# Patient Record
Sex: Female | Born: 1990 | Race: Black or African American | Hispanic: No | Marital: Married | Smoking: Never smoker
Health system: Southern US, Community
[De-identification: ages and names within clinical notes are randomized; demographics above are authoritative.]

## PROBLEM LIST (undated history)

## (undated) DIAGNOSIS — K219 Gastro-esophageal reflux disease without esophagitis: Secondary | ICD-10-CM

## (undated) DIAGNOSIS — F09 Unspecified mental disorder due to known physiological condition: Secondary | ICD-10-CM

## (undated) DIAGNOSIS — F419 Anxiety disorder, unspecified: Secondary | ICD-10-CM

## (undated) DIAGNOSIS — E7800 Pure hypercholesterolemia, unspecified: Secondary | ICD-10-CM

## (undated) DIAGNOSIS — Z8619 Personal history of other infectious and parasitic diseases: Secondary | ICD-10-CM

## (undated) DIAGNOSIS — G35 Multiple sclerosis: Secondary | ICD-10-CM

## (undated) HISTORY — DX: Personal history of other infectious and parasitic diseases: Z86.19

## (undated) HISTORY — PX: NO PAST SURGERIES: SHX2092

## (undated) HISTORY — DX: Unspecified mental disorder due to known physiological condition: F09

---

## 2020-11-26 LAB — HM HIV SCREENING LAB: HM HIV Screening: NEGATIVE

## 2020-11-30 DIAGNOSIS — J302 Other seasonal allergic rhinitis: Secondary | ICD-10-CM | POA: Insufficient documentation

## 2020-11-30 DIAGNOSIS — G35 Multiple sclerosis: Secondary | ICD-10-CM | POA: Insufficient documentation

## 2020-12-25 DIAGNOSIS — R768 Other specified abnormal immunological findings in serum: Secondary | ICD-10-CM | POA: Insufficient documentation

## 2021-01-12 ENCOUNTER — Emergency Department: Payer: BC Managed Care – PPO

## 2021-01-12 ENCOUNTER — Encounter: Payer: Self-pay | Admitting: Emergency Medicine

## 2021-01-12 ENCOUNTER — Emergency Department
Admission: EM | Admit: 2021-01-12 | Discharge: 2021-01-13 | Disposition: A | Discharge status: Home / Self Care | Payer: BC Managed Care – PPO | Attending: Emergency Medicine | Admitting: Emergency Medicine

## 2021-01-12 ENCOUNTER — Other Ambulatory Visit: Payer: Self-pay

## 2021-01-12 DIAGNOSIS — R079 Chest pain, unspecified: Secondary | ICD-10-CM | POA: Insufficient documentation

## 2021-01-12 HISTORY — DX: Multiple sclerosis: G35

## 2021-01-12 HISTORY — DX: Gastro-esophageal reflux disease without esophagitis: K21.9

## 2021-01-12 HISTORY — DX: Anxiety disorder, unspecified: F41.9

## 2021-01-12 HISTORY — DX: Pure hypercholesterolemia, unspecified: E78.00

## 2021-01-12 LAB — BASIC METABOLIC PANEL
Anion gap: 4 — ABNORMAL LOW (ref 5–15)
BUN: 15 mg/dL (ref 6–20)
CO2: 28 mmol/L (ref 22–32)
Calcium: 9 mg/dL (ref 8.9–10.3)
Chloride: 104 mmol/L (ref 98–111)
Creatinine, Ser: 1.06 mg/dL — ABNORMAL HIGH (ref 0.44–1.00)
GFR, Estimated: >60 mL/min (ref >60)
Glucose, Bld: 97 mg/dL (ref 70–99)
Potassium: 4.4 mmol/L (ref 3.5–5.1)
Sodium: 136 mmol/L (ref 135–145)

## 2021-01-12 LAB — CBC
HCT: 35 % — ABNORMAL LOW (ref 36.0–46.0)
Hemoglobin: 12.5 g/dL (ref 12.0–15.0)
MCH: 28.5 pg (ref 26.0–34.0)
MCHC: 35.7 g/dL (ref 30.0–36.0)
MCV: 79.9 fL — ABNORMAL LOW (ref 80.0–100.0)
Platelets: 242 10*3/uL (ref 150–400)
RBC: 4.38 MIL/uL (ref 3.87–5.11)
RDW: 14.4 % (ref 11.5–15.5)
WBC: 6.4 10*3/uL (ref 4.0–10.5)
nRBC: 0 % (ref 0.0–0.2)

## 2021-01-12 LAB — TROPONIN I (HIGH SENSITIVITY): Troponin I (High Sensitivity): <2 ng/L (ref <18)

## 2021-01-12 LAB — POC URINE PREG, ED: Preg Test, Ur: NEGATIVE

## 2021-01-12 MED ORDER — IBUPROFEN 400 MG PO TABS
400.0000 mg | ORAL_TABLET | Freq: Once | ORAL | Status: DC
  Filled 2021-01-12: qty 1

## 2021-01-12 NOTE — Discharge Instructions (Signed)
Your EKG, chest x-ray and cardiac enzymes were all reassuring today.  Reasons to return to the emergency department would be if your pain is worsening or you develop any new symptoms that are concerning to you including shortness of breath.  Please follow-up with your primary care provider if symptoms or not improving.  You can take NSAIDs including Motrin for your pain.

## 2021-01-12 NOTE — ED Triage Notes (Signed)
Pt c/o left sided chest pain that radiates to her left shoulder that started about 1900 tonight.

## 2021-01-12 NOTE — ED Provider Notes (Signed)
[facility]  ____________________________________________   Event Date/Time   First MD Initiated Contact with Patient 01/12/21 2305     (approximate)  I have reviewed the triage vital signs and the nursing notes.   HISTORY  Chief Complaint Chest Pain    HPI Allison Moon is a 30 y.o. female with past medical history of multiple sclerosis, hyperlipidemia who presents with chest pain.  Symptoms started around 7 PM tonight.  She endorses sharp pain in the left side of her chest that does not radiate.  There is a constant dull ache and then intermittent sharp pain.  It is worse when she twists her body or with deep breathing.  She denies associated dyspnea nausea vomiting diaphoresis.  No recent illnesses, no fever cough chills hemoptysis.  Denies any precipitating musculoskeletal cause.  The patient denies hx of prior DVT/PE, unilateral leg pain/swelling, hormone use, recent surgery, hx of cancer, prolonged immobilization, or hemoptysis.  She does note that she has been more tired over the last 2 weeks and has been sleeping more frequently.          Past Medical History:  Diagnosis Date   Anxiety    GERD (gastroesophageal reflux disease)    Hypercholesteremia    MS (multiple sclerosis) (HCC)     There are no problems to display for this patient.   History reviewed. No pertinent surgical history.  Prior to Admission medications   Not on File    Allergies Patient has no allergy information on record.  No family history on file.  Social History Social History   Tobacco Use   Smoking status: Never   Smokeless tobacco: Never  Substance Use Topics   Alcohol use: Yes    Comment: occ   Drug use: Never    Review of Systems   Review of Systems  Constitutional:  Negative for chills and fever.  Respiratory:  Positive for chest tightness. Negative for cough and shortness of breath.   Cardiovascular:  Positive for chest pain.  Gastrointestinal:   Negative for abdominal pain, nausea and vomiting.  All other systems reviewed and are negative.  Physical Exam Updated Vital Signs BP 109/73    Pulse 74    Temp 98.7 F (37.1 C)    Resp 18    Ht 5\' 4"  (1.626 m)    Wt 55.8 kg    LMP 12/19/2020    SpO2 99%    BMI 21.11 kg/m   Physical Exam Vitals and nursing note reviewed.  Constitutional:      General: She is not in acute distress.    Appearance: Normal appearance.  HENT:     Head: Normocephalic and atraumatic.  Eyes:     General: No scleral icterus.    Conjunctiva/sclera: Conjunctivae normal.  Cardiovascular:     Rate and Rhythm: Normal rate and regular rhythm.     Heart sounds: Normal heart sounds.  Pulmonary:     Effort: Pulmonary effort is normal. No respiratory distress.     Breath sounds: No stridor.  Musculoskeletal:        General: No deformity or signs of injury.     Cervical back: Normal range of motion.     Right lower leg: No edema.     Left lower leg: No edema.  Skin:    General: Skin is dry.     Coloration: Skin is not jaundiced or pale.  Neurological:     General: No focal deficit present.  Mental Status: She is alert and oriented to person, place, and time. Mental status is at baseline.  Psychiatric:        Mood and Affect: Mood normal.        Behavior: Behavior normal.     LABS (all labs ordered are listed, but only abnormal results are displayed)  Labs Reviewed  BASIC METABOLIC PANEL - Abnormal; Notable for the following components:      Result Value   Creatinine, Ser 1.06 (*)    Anion gap 4 (*)    All other components within normal limits  CBC - Abnormal; Notable for the following components:   HCT 35.0 (*)    MCV 79.9 (*)    All other components within normal limits  POC URINE PREG, ED  TROPONIN I (HIGH SENSITIVITY)   ____________________________________________  EKG  NSR, nml axis, nml intervals, no acute ischemic  changes  ____________________________________________  RADIOLOGY Ky Barban, personally viewed and evaluated these images (plain radiographs) as part of my medical decision making, as well as reviewing the written report by the radiologist.  ED MD interpretation:  I reviewed the CXR which does not show any acute cardiopulmonary process      ____________________________________________   PROCEDURES  Procedure(s) performed (including Critical Care):  Procedures   ____________________________________________   INITIAL IMPRESSION / ASSESSMENT AND PLAN / ED COURSE     Patient is a 30 year old female presenting with chest pain.  Symptoms started tonight.  Pain is atypical and that it is sharp and pleuritic not worse with exertion and worse with twisting movements of her body.  Vital signs within normal limits.  She appears well on exam lungs are clear she has no evidence of DVT on exam.  She is PERC negative.  My suspicion for ACS is exceedingly low given she has no risk factors and pain is very atypical.  Chest x-ray does not show any infiltrate.  Suspect pleurisy versus musculoskeletal etiology.  We discussed following up with her PCP if symptoms or not improved and also discussed return precautions to the ED.  Will treat supportively with NSAIDs.  Clinical Course as of 01/12/21 6468  Sat Jan 12, 2021  2330 Preg Test, Ur: NEGATIVE [KM]    Clinical Course User Index [KM] Georga Hacking, MD     ____________________________________________   FINAL CLINICAL IMPRESSION(S) / ED DIAGNOSES  Final diagnoses:  Chest pain, unspecified type     ED Discharge Orders     None        Note:  This document was prepared using Dragon voice recognition software and may include unintentional dictation errors.    Georga Hacking, MD 01/12/21 [phone]

## 2021-01-12 NOTE — ED Notes (Signed)
Pt had to use bathroom on arrival prior to obtaining EKG.

## 2021-01-13 NOTE — ED Notes (Signed)
Pt discharge information reviewed. Pt understands need for follow up care and when to return if symptoms worsen. All questions answered. Pt is alert and oriented with even and regular respirations. Pt is seen ambulating out of department with string steady gait.   

## 2021-01-21 ENCOUNTER — Encounter: Payer: Self-pay | Admitting: Nurse Practitioner

## 2021-01-21 ENCOUNTER — Other Ambulatory Visit (HOSPITAL_COMMUNITY)
Admission: RE | Admit: 2021-01-21 | Discharge: 2021-01-21 | Disposition: A | Discharge status: Home / Self Care | Payer: BC Managed Care – PPO | Source: Ambulatory Visit | Attending: Family Medicine | Admitting: Family Medicine

## 2021-01-21 ENCOUNTER — Other Ambulatory Visit: Payer: Self-pay

## 2021-01-21 ENCOUNTER — Ambulatory Visit (INDEPENDENT_AMBULATORY_CARE_PROVIDER_SITE_OTHER): Payer: BC Managed Care – PPO | Admitting: Nurse Practitioner

## 2021-01-21 VITALS — BP 110/72 | HR 75 | Temp 97.7°F | Resp 18 | Ht 64.0 in | Wt 125.4 lb

## 2021-01-21 DIAGNOSIS — E785 Hyperlipidemia, unspecified: Secondary | ICD-10-CM

## 2021-01-21 DIAGNOSIS — R1084 Generalized abdominal pain: Secondary | ICD-10-CM | POA: Diagnosis not present

## 2021-01-21 DIAGNOSIS — K219 Gastro-esophageal reflux disease without esophagitis: Secondary | ICD-10-CM | POA: Diagnosis not present

## 2021-01-21 DIAGNOSIS — G35 Multiple sclerosis: Secondary | ICD-10-CM

## 2021-01-21 DIAGNOSIS — N898 Other specified noninflammatory disorders of vagina: Secondary | ICD-10-CM

## 2021-01-21 DIAGNOSIS — R141 Gas pain: Secondary | ICD-10-CM

## 2021-01-21 DIAGNOSIS — Z7689 Persons encountering health services in other specified circumstances: Secondary | ICD-10-CM

## 2021-01-21 NOTE — Progress Notes (Signed)
BP 110/72    Pulse 75    Temp 97.7 F (36.5 C) (Oral)    Resp 18    Ht 5\' 4"  (1.626 m)    Wt 125 lb 6.4 oz (56.9 kg)    LMP 01/13/2021    SpO2 99%    BMI 21.52 kg/m    Subjective:    Patient ID: Allison Moon, female    DOB: 1990/03/19, 31 y.o.   MRN: [id number]  HPI: Allison Moon is a 31 y.o. female, here alone  Chief Complaint  Patient presents with   Establish Care   Establish care: She recently moved from [place]. Last physical was in October.  She says she does have high cholesterol.   MS: She is currently taking  Copaxone but is working on changing to ocrelizumab. Care provided by Dr. Aldean Ast with [facility]. She saw him last on 12/17/2020. Her last MS flare was 11/29/20 where she was admitted for IV steroids. MRI on 12/01/20 showed: 1. New active demyelinating lesion within the right corona radiata measuring 8 mm. 2. There are 2 additional new T2 hyperintense nonenhancing white matter lesions within the supratentorial brain as compared with 05/06/2020 MRI. 3. There are 3 demyelinating lesions within the cervicothoracic cord without evidence of enhancement as detailed above. The upper cervical cord lesion was previously seen and is stable. The additional two lesions were not previously imaged and are age indeterminate. 4. Incidentally noted enlarged thyroid gland without discrete nodule. The preliminary report (critical or emergent communication) was reviewed prior to this dictation and there are no substantial differences between the preliminary results and the impressions in this final report. Electronically Reviewed by: Tyler Aas, MD, [facility] Electronically Reviewed on: 12/01/2020 11:00 AM I have reviewed the images and concur with the above findings. Electronically Signed by: Consuelo Pandy, MD, [facility] Electronically Signed on: 12/01/2020 1:39 PM  Hyperlipidemia:  Working on controlling it with diet and exercise.   Hep B: She recently tested positive for hepatitis B  core antibody for drug pre testing at the neurology clinic. She sees Dr. Guido Sander, from the [facility], for this. She saw him on 12/25/20. Was prescribed tenofovir.  When she received the medication they were blue and she avoids dyes.  PA is being Viread (tenofovir).   GERD/abdominal pain/gas: She says for years she has had chronic abdominal pain, gas and increasing acid reflux.  BMs are about 2-3 times a week. She says she used to be under the care of a GI specialist and would like to be referred to one again since she has moved here.  She says she takes pantoprazole 40 mg daily. Discussed if she is eating foods that have fiber and she says she does.  She says she has tried many things the only time her stomach did not hurt was when she was on a low carb diet but she is not doing that anymore.    Vaginal discharge/itching:  She says she has had scant vaginal discharge but mainly vaginal itching for a few months.  Will get a vaginal swab.   Relevant past medical, surgical, family and social history reviewed and updated as indicated. Interim medical history since our last visit reviewed. Allergies and medications reviewed and updated.  Review of Systems  Constitutional: Negative for fever or weight change.  Respiratory: Negative for cough and shortness of breath.   Cardiovascular: Negative for chest pain or palpitations.  Gastrointestinal: Positive for abdominal pain, no bowel changes, chronically constipated  Genitourinary: positive for vaginal itching and discharge Musculoskeletal: Negative for gait problem or joint swelling.  Skin: Negative for rash.  Neurological: Negative for dizziness or headache.  No other specific complaints in a complete review of systems (except as listed in HPI above).      Objective:    BP 110/72    Pulse 75    Temp 97.7 F (36.5 C) (Oral)    Resp 18    Ht 5\' 4"  (1.626 m)    Wt 125 lb 6.4 oz (56.9 kg)    LMP 01/13/2021    SpO2 99%    BMI 21.52 kg/m   Wt  Readings from Last 3 Encounters:  01/21/21 125 lb 6.4 oz (56.9 kg)  01/12/21 123 lb (55.8 kg)    Physical Exam  Constitutional: Patient appears well-developed and well-nourished.  No distress.  HEENT: head atraumatic, normocephalic, pupils equal and reactive to light, neck supple Cardiovascular: Normal rate, regular rhythm and normal heart sounds.  No murmur heard. No BLE edema. Pulmonary/Chest: Effort normal and breath sounds normal. No respiratory distress. Abdominal: Soft.  There is no tenderness. Psychiatric: Patient has a normal mood and affect. behavior is normal. Judgment and thought content normal.   Results for orders placed or performed during the hospital encounter of [id number]  Basic metabolic panel  Result Value Ref Range   Sodium 136 135 - 145 mmol/L   Potassium 4.4 3.5 - 5.1 mmol/L   Chloride 104 98 - 111 mmol/L   CO2 28 22 - 32 mmol/L   Glucose, Bld 97 70 - 99 mg/dL   BUN 15 6 - 20 mg/dL   Creatinine, Ser 1.06 (H) 0.44 - 1.00 mg/dL   Calcium 9.0 8.9 - 10.3 mg/dL   GFR, Estimated >60 >60 mL/min   Anion gap 4 (L) 5 - 15  CBC  Result Value Ref Range   WBC 6.4 4.0 - 10.5 K/uL   RBC 4.38 3.87 - 5.11 MIL/uL   Hemoglobin 12.5 12.0 - 15.0 g/dL   HCT 35.0 (L) 36.0 - 46.0 %   MCV 79.9 (L) 80.0 - 100.0 fL   MCH 28.5 26.0 - 34.0 pg   MCHC 35.7 30.0 - 36.0 g/dL   RDW 14.4 11.5 - 15.5 %   Platelets 242 150 - 400 K/uL   nRBC 0.0 0.0 - 0.2 %  POC urine preg, ED  Result Value Ref Range   Preg Test, Ur NEGATIVE NEGATIVE  Troponin I (High Sensitivity)  Result Value Ref Range   Troponin I (High Sensitivity) <2 <18 ng/L      Assessment & Plan:   1. Multiple sclerosis ([place]) -continue to follow neurologist recommendations  2. Hyperlipidemia, unspecified hyperlipidemia type -continue working on diet and exercise -will check labs at next visit  3. Gastroesophageal reflux disease without esophagitis -continue current treatment - Ambulatory referral to  Gastroenterology  4. Generalized abdominal pain  - Ambulatory referral to Gastroenterology  5. Gas pain  - Ambulatory referral to Gastroenterology  6. Vaginal discharge  - Cervicovaginal ancillary only  7. Vaginal itching  - Cervicovaginal ancillary only 8. Encounter to establish care -getting records from previous provider  Follow up plan: Return in about 6 months (around 07/21/2021) for cpe.

## 2021-01-22 ENCOUNTER — Telehealth (HOSPITAL_COMMUNITY): Payer: Self-pay

## 2021-01-22 LAB — CERVICOVAGINAL ANCILLARY ONLY
Bacterial Vaginitis (gardnerella): NEGATIVE
Candida Glabrata: NEGATIVE
Candida Vaginitis: NEGATIVE
Chlamydia: NEGATIVE
Comment: NEGATIVE
Comment: NEGATIVE
Comment: NEGATIVE
Comment: NEGATIVE
Comment: NEGATIVE
Comment: NORMAL
Neisseria Gonorrhea: NEGATIVE
Trichomonas: NEGATIVE

## 2021-01-22 NOTE — Telephone Encounter (Signed)
Pt called inquiring if orders for Ocrevus have been received and if infusion appointment can be made. Pt states she is being referred by [facility] to receive [facility] infusions [facility]. Pt informed we have not yet received the orders ,encouraged patient to contact provider to reach out to [facility] regarding what orders will be needed for infusion. Offered to provide patient with phone number, she states she has the phone number and will contact her provider today.

## 2021-02-01 ENCOUNTER — Encounter: Payer: Self-pay | Admitting: Nurse Practitioner

## 2021-02-04 ENCOUNTER — Encounter: Payer: Self-pay | Admitting: Nurse Practitioner

## 2021-02-06 ENCOUNTER — Telehealth (INDEPENDENT_AMBULATORY_CARE_PROVIDER_SITE_OTHER): Payer: BC Managed Care – PPO | Admitting: Nurse Practitioner

## 2021-02-06 ENCOUNTER — Other Ambulatory Visit: Payer: Self-pay

## 2021-02-06 ENCOUNTER — Encounter: Payer: Self-pay | Admitting: Nurse Practitioner

## 2021-02-06 DIAGNOSIS — R748 Abnormal levels of other serum enzymes: Secondary | ICD-10-CM | POA: Diagnosis not present

## 2021-02-06 DIAGNOSIS — T7840XA Allergy, unspecified, initial encounter: Secondary | ICD-10-CM | POA: Diagnosis not present

## 2021-02-06 NOTE — Progress Notes (Addendum)
Name: Allison BreechDoreen Moon   MRN: [id number]    DOB: [birth year]   Date:02/06/2021       Progress Note  Subjective  Chief Complaint  Chief Complaint  Patient presents with   Referral    Allergist   Immunizations   Consult    Discuss lab results    I connected with  Allison Moon  on 02/06/21 at 2:00pm by a video enabled telemedicine application and verified that I am speaking with the correct person using two identifiers.  I discussed the limitations of evaluation and management by telemedicine and the availability of in person appointments. The patient expressed understanding and agreed to proceed with a virtual visit  Staff also discussed with the patient that there may be a patient responsible charge related to this service. Patient Location: home Provider Location: [facility] Additional Individuals present: alone  HPI  Allergies: She says she has been dealing with her allergies all her life. She says she used to get allergy shots.  She says that she has been taking xyzal, flonase, and eye drops to help with symptoms.  Symptoms include itchy watery eyes, nasal congestion and drainage.  She says over the last two months it has gotten worse.  She would like to go back to seeing an allergist, will placed referral.   Elevated liver enzymes:  She says that she got blood work from [facility] from her rheumatologist and noticed that her liver enzymes were elevated.  She says that she has not started taking the tenofovir yet.  Discussed that she needs to reach out to Dr[facility]  She was positive for the hepatitis b core antibody, which was done on 11/26/2020.    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   Multiple sclerosis (HCC) 11/30/2020   Seasonal allergies 11/30/2020    Social History   Tobacco Use   Smoking status: Never   Smokeless tobacco: Never  Substance Use Topics   Alcohol use: Yes    Comment: occ      Current Outpatient Medications:    albuterol (VENTOLIN HFA) 108 (90 Base) MCG/ACT inhaler, albuterol sulfate HFA 90 mcg/actuation aerosol inhaler  INHALE 2 PUFFS EVERY 6 HOURS AS NEEDED FOR WHEEZING, Disp: , Rfl:    baclofen (LIORESAL) 10 MG tablet, 1 tablet (10 mg total), Disp: , Rfl:    cetirizine (ZYRTEC) 10 MG tablet, Take 10 mg by mouth daily., Disp: , Rfl:    fluticasone (FLONASE) 50 MCG/ACT nasal spray, 3 (three) times a week, Disp: , Rfl:    Glatiramer Acetate 40 MG/ML SOSY, 3 (three) times a week, Disp: , Rfl:    MYRBETRIQ 25 MG TB24 tablet, Take 25 mg by mouth daily., Disp: , Rfl:    pantoprazole (PROTONIX) 40 MG tablet, Please take one tablet 3 times a week., Disp: , Rfl:    tenofovir (VIREAD) 300 MG tablet, Take by mouth., Disp: , Rfl:   Allergies  Allergen Reactions   Avocado Other (See Comments) and Nausea And Vomiting   Citrullus Vulgaris Nausea Only    I personally reviewed active problem list, medication list, allergies, notes from last encounter with the patient/caregiver today.  ROS  Constitutional: Negative for fever or weight change.  HEENT: positive for nasal congestion, drainage, itchy watery eyes Respiratory: Negative for cough and shortness of breath.   Cardiovascular: Negative for chest pain or palpitations.  Gastrointestinal: Negative for abdominal pain,  no bowel changes.  Musculoskeletal: Negative for gait problem or joint swelling.  Skin: Negative for rash.  Neurological: Negative for dizziness or headache.  No other specific complaints in a complete review of systems (except as listed in HPI above).   Objective  Virtual encounter, vitals not obtained.  There is no height or weight on file to calculate BMI.  Nursing Note and Vital Signs reviewed.  Physical Exam  Awake, alert and oriented, speaking in complete sentences  No results found for this or any previous visit (from the past 72 hour(s)).  Assessment & Plan  1. Allergy, initial  encounter  - Ambulatory referral to Allergy  2. Elevated liver enzymes -Reach out to Dr. Teena Dunk at [facility] and when to present for emergency care or RTC including fever >101.64F, chest pain, shortness of breath, new/worsening/un-resolving symptoms,  reviewed with patient at time of visit. Follow up and care instructions discussed and provided in AVS. - I discussed the assessment and treatment plan with the patient. The patient was provided an opportunity to ask questions and all were answered. The patient agreed with the plan and demonstrated an understanding of the instructions.  I provided 15 minutes of non-face-to-face time during this encounter.  Bo Merino, FNP

## 2021-02-13 ENCOUNTER — Ambulatory Visit (LOCAL_COMMUNITY_HEALTH_CENTER): Payer: BC Managed Care – PPO

## 2021-02-13 ENCOUNTER — Other Ambulatory Visit: Payer: Self-pay

## 2021-02-13 DIAGNOSIS — Z719 Counseling, unspecified: Secondary | ICD-10-CM

## 2021-02-13 DIAGNOSIS — Z23 Encounter for immunization: Secondary | ICD-10-CM | POA: Diagnosis not present

## 2021-02-13 NOTE — Progress Notes (Signed)
Attestation: I agree with the advice given to this patient by our nurse staff.  I have reviewed the RN's note and chart. Documentation reflects my recommendations which were given verbally to the nurse at the point of care.   Bobbiejo Ishikawa [place] Delara Shepheard, MD, MPH, AB[facility] Medical Director  [facility]  

## 2021-02-13 NOTE — Progress Notes (Addendum)
Patient in nurse clinic for Varicella and Tdap vaccine. States she has Multiple Sclerosis and will start taking Ocrevus 2/17. Her doctor recommended that she receive Varicella due to the medication being a "B-cell Depleter". Patient denies receiving previous doses of vaccine and denies ever having Chicken Pox. Gave report to Dr. Ernestina Patches and was approved to administer vaccine. Tdap and Varicella vaccines administered and patient tolerated well. [facility] updated and copy given to patient. Recommended scheduled for next dose explained and patient verbalized understanding. Chart routed to Dr. Ernestina Patches. Ranae Palms, RN

## 2021-02-13 NOTE — Progress Notes (Addendum)
Patient returned to nurse clinic with immunization records. Records indicate that patient received Varicella series. Nurse updated [facility], copy given to patient. Recommended schedule explained and patient verbalized understanding. Ann Held, RN

## 2021-03-11 ENCOUNTER — Other Ambulatory Visit: Payer: Self-pay

## 2021-03-11 ENCOUNTER — Encounter (INDEPENDENT_AMBULATORY_CARE_PROVIDER_SITE_OTHER): Payer: Self-pay | Admitting: Gastroenterology

## 2021-03-11 ENCOUNTER — Encounter: Payer: Self-pay | Admitting: Gastroenterology

## 2021-03-11 DIAGNOSIS — N3281 Overactive bladder: Secondary | ICD-10-CM | POA: Insufficient documentation

## 2021-03-11 DIAGNOSIS — K219 Gastro-esophageal reflux disease without esophagitis: Secondary | ICD-10-CM | POA: Insufficient documentation

## 2021-03-11 DIAGNOSIS — K3189 Other diseases of stomach and duodenum: Secondary | ICD-10-CM | POA: Insufficient documentation

## 2021-03-11 DIAGNOSIS — L219 Seborrheic dermatitis, unspecified: Secondary | ICD-10-CM | POA: Insufficient documentation

## 2021-03-11 DIAGNOSIS — R351 Nocturia: Secondary | ICD-10-CM | POA: Insufficient documentation

## 2021-03-13 ENCOUNTER — Ambulatory Visit: Payer: BC Managed Care – PPO | Admitting: Physical Therapy

## 2021-03-13 ENCOUNTER — Other Ambulatory Visit: Payer: Self-pay

## 2021-03-14 NOTE — Progress Notes (Signed)
error 

## 2021-04-03 ENCOUNTER — Encounter: Payer: Self-pay | Admitting: Physical Therapy

## 2021-04-03 ENCOUNTER — Other Ambulatory Visit: Payer: Self-pay

## 2021-04-03 ENCOUNTER — Ambulatory Visit: Payer: BC Managed Care – PPO | Attending: Obstetrics and Gynecology | Admitting: Physical Therapy

## 2021-04-03 DIAGNOSIS — M62838 Other muscle spasm: Secondary | ICD-10-CM | POA: Diagnosis present

## 2021-04-03 DIAGNOSIS — M6281 Muscle weakness (generalized): Secondary | ICD-10-CM | POA: Insufficient documentation

## 2021-04-03 DIAGNOSIS — R278 Other lack of coordination: Secondary | ICD-10-CM | POA: Insufficient documentation

## 2021-04-03 NOTE — Therapy (Signed)
[place] [facility] ?[address] [place], [place], [address] ?Phone: [phone]   Fax:  [phone] ? ?Physical Therapy Evaluation ? ?Patient Details  ?Name: Allison Moon ?MRN: [id number] ?Date of Birth: December 15, 1990 ?Referring Provider (PT): Mathis Fare ? ? ?Encounter Date: 04/03/2021 ? ? PT End of Session - 04/03/21 1507   ? ? Visit Number 1   ? Number of Visits 12   ? Date for PT Re-Evaluation 06/26/21   ? Authorization Type IE 04/03/2021   ? PT Start Time 1500   ? PT Stop Time 1540   ? PT Time Calculation (min) 40 min   ? Activity Tolerance Patient tolerated treatment well   ? Behavior During Therapy [facility] for tasks assessed/performed   ? ?  ?  ? ?  ? ? ?Past Medical History:  ?Diagnosis Date  ? Anxiety   ? GERD (gastroesophageal reflux disease)   ? Hypercholesteremia   ? MS (multiple sclerosis) (HCC)   ? ? ?History reviewed. No pertinent surgical history. ? ?There were no vitals filed for this visit. ? ? ? ? ? ? ? [facility] PT Assessment - 04/03/21 0001   ? ?  ? Assessment  ? Medical Diagnosis [facility]   ? Referring Provider (PT) Mathis Fare   ? Hand Dominance Right   ? Prior Therapy Yes   ?  ? Balance Screen  ? Has the patient fallen in the past 6 months Yes   ? How many times? 3   ? Has the patient had a decrease in activity level because of a fear of falling?  Yes   ? Is the patient reluctant to leave their home because of a fear of falling?  Yes   ? ?  ?  ? ?  ? ?PELVIC HEALTH PHYSICAL THERAPY EVALUATION ? ?SCREENING ?Red Flags: None ?Have you had any night sweats? ?Unexplained weight loss? ?Saddle anesthesia? ?Unexplained changes in bowel or bladder habits? ? ?Precautions: falls, MS, hepatitis B ? ?SUBJECTIVE ? ?Chief Complaint: Patient reports that she only recently realized that her urinary symptoms come in phases. Patient notes that her symptoms were worse with most recent MS flare. Patient notes that her nocturia is worse that diurnal voids. Patient does note that she  goes through periods of being able to delay urge for 5-10 minutes with no leakage to not being able to delay at all.  ? ?Pertinent History:  ?Falls Positive.  ?Pulmonary disease/dysfunction Negative. ?Surgical history: Negative.  ? ?Recent Procedures/Tests/Findings: Recent liver biopsy due to abnormally elevated LFTs. Working with GI to manage. Denies any symptoms other than fatigue which she attributes to MS.  ? ?Obstetrical History: ?G0P0 ? ?Gynecological History: ?Hysterectomy: No Vaginal/Abdominal ?Endometriosis: Negative ?Pelvic Organ Prolapse: Negative ?Last Menstrual Period:  ?Pain with exam: Yes (4-7/10 pain) ?Heaviness/pressure: No ?  ?Urinary History: ?Incontinence: Positive. Onset: years Triggers: urge Amount: Min/Mod. ?Protective undergarments: Yes  Type: pantyliner/pads  Number used/day: 1-4x ?Fluid Intake: 1-4x 16.9 oz H20, nothing caffeinated, no juices/sodas, apple cider vinegar ?Nocturia: 4-12x/night ?Frequency of urination: every 1 hours after initial void; initial void sometimes up to 5 hours after waking ?Pain with urination: Negative ?Difficulty initiating urination: Negative ?Intermittent stream: Negative ?Frequent UTI: Negative.  ? ?Gastrointestinal History: ?Bristol Stool Chart: Type 1 ?Frequency of BMs: 3x/week ?Pain with defecation: Positive for sometimes. ?Straining with defecation: Positive ?Hemorrhoids: Positive; external; latent ?Toileting posture: feet flat; occasional stool use ?Incontinence: Negative.  ? ?Sexual activity/pain: ?Pain with intercourse: Positive.  ? Initial penetration:  Yes  Deep thrusting: unable to participate ?External stimulation: No  ? ?Location of pain: coccyx ?Current pain:  4/10  ?Max pain:  6/10 ?Least pain:  0/10 ?Pain quality: pain quality: aching and dull ?Radiating pain: No  ? ?Current activities:  ?Sleeping until 10A, stretching, volunteer, walking, medical appointments. Lives with husband.  ? ? ?OBJECTIVE ? ?Mental Status ?Patient is oriented to person,  place and time.  ?Recent memory is intact.  ?Remote memory is intact.  ?Attention span and concentration are intact.  ?Expressive speech is intact.  ?Patient's fund of knowledge is within normal limits for educational level. ? ?POSTURE/OBSERVATIONS:  ?Lumbar lordosis: increased ?Thoracic kyphosis: WNL ?Iliac crest height: appearing equal bilaterally ?Patient stands resting on Y ligaments with anteriorly tilted pelvis. Apparent form closure preference for stability over force closure. ? ?GAIT: ?Patient ambulates without AD. Patient has adequate foot clearance B, but does have decreased eccentric control with initial contact, B.  ? ?RANGE OF MOTION: deferred 2/2 to time constraints ?  LEFT RIGHT  ?Lumbar forward flexion (65):      ?Lumbar extension (30):     ?Lumbar lateral flexion (25):     ?Thoracic and Lumbar rotation (30 degrees):       ?Hip Flexion (0-125):      ?Hip IR (0-45):     ?Hip ER (0-45):     ?Hip Abduction (0-40):     ?Hip extension (0-15):     ? ? ?SENSATION: ?deferred 2/2 to time constraints ? ?STRENGTH: MMT deferred 2/2 to time constraints ? RLE LLE  ?Hip Flexion    ?Hip Extension    ?Hip Abduction     ?Hip Adduction     ?Hip ER     ?Hip IR     ?Knee Extension    ?Knee Flexion    ?Dorsiflexion     ?Plantarflexion (seated)    ? ?ABDOMINAL: deferred 2/2 to time constraints ?Palpation: ?Diastasis: ?Scar mobility: ?Rib flare: ? ?SPECIAL TESTS: deferred 2/2 to time constraints ? ? ?PHYSICAL PERFORMANCE MEASURES: ?STS: WFL ? ? ?EXTERNAL PELVIC EXAM: deferred 2/2 to time constraints ?Palpation: ?Breath coordination: ?Voluntary Contraction: present/absent ?Relaxation: full/delayed/non-relaxing ?Perineal movement with sustained IAP increase ("bear down"): descent/no change/elevation/excessive descent ?Perineal movement with rapid IAP increase ("cough"): elevation/no change/descent ? ?INTERNAL VAGINAL EXAM: deferred 2/2 to time constraints ?Introitus Appears:  ?Skin integrity:  ?Scar mobility: ?Strength  (PERF):  ?Symmetry: ?Palpation: ?Prolapse: ? ? ?INTERNAL RECTAL EXAM: not indicated ?Strength (PERF): ?Symmetry: ?Palpation: ?Prolapse: ? ? ?OUTCOME MEASURES: FOTO (Urinary Problem 46; PFDI Urinary 50) ? ? ?ASSESSMENT ?Patient is a 31 year old presenting to clinic with chief complaints of urinary urgency and frequency. Today's evaluation is suggestive of deficits in PFM coordination, bladder habits, posture, PFM strength, and pain as evidenced by pain with initial penetration and sensation of "hitting a wall", UUI, nocturia 4-12x, inability to delay urge, anteriorly tilted pelvis with increased reliance on joint congruency and ligamentous structures for stability, and straining with defecation. Patient's responses on FOTO outcome measures (Urinary Problem 46; PFDI Urinary 50) indicate significant functional limitations/disability/distress. Patient's progress may be limited due to presence of multiple comorbidities; however, patient's attendance at today's evaluation is advantageous. Patient will benefit from continued skilled therapeutic intervention to address deficits in PFM coordination, bladder habits, posture, PFM strength, and pain in order to increase function and improve overall QOL. ? ?EDUCATION ?Patient educated on prognosis, POC, and provided with HEP including: not initiated. Patient articulated understanding and returned demonstration. Patient will benefit from further education  in order to maximize compliance and understanding for long-term therapeutic gains. ? ? ? ? ? ? ?Objective measurements completed on examination: See above findings.  ? ? ? ? ? ? ? ? ? ? ? ? ? ? ? ? ? ? ? PT Long Term Goals - 04/03/21 1824   ? ?  ? PT LONG TERM GOAL #1  ? Title Patient will demonstrate independence with HEP in order to maximize therapeutic gains and improve carryover from physical therapy sessions to ADLs in the home and community.   ? Baseline not initiated   ? Time 12   ? Period Weeks   ? Status New   ? Target  Date 06/26/21   ?  ? PT LONG TERM GOAL #2  ? Title Patient will demonstrate independent and coordinated diaphragmatic breathing in supine with a 1:2 breathing pattern for improved down-regulation of the nervou

## 2021-04-10 ENCOUNTER — Encounter: Payer: Self-pay | Admitting: Physical Therapy

## 2021-04-10 ENCOUNTER — Ambulatory Visit: Payer: BC Managed Care – PPO | Admitting: Physical Therapy

## 2021-04-10 ENCOUNTER — Other Ambulatory Visit: Payer: Self-pay

## 2021-04-10 DIAGNOSIS — M6281 Muscle weakness (generalized): Secondary | ICD-10-CM

## 2021-04-10 DIAGNOSIS — M62838 Other muscle spasm: Secondary | ICD-10-CM

## 2021-04-10 DIAGNOSIS — R278 Other lack of coordination: Secondary | ICD-10-CM

## 2021-04-10 NOTE — Therapy (Signed)
?OUTPATIENT PHYSICAL THERAPY TREATMENT NOTE ? ? ?Patient Name: Allison Moon ?MRN: [id number] ?DOB:05-10-1990, 31 y.o., female ?Today's Date: 04/10/2021 ? ?PCP: Berniece Salines, FNP ?REFERRING PROVIDER: Westley Gambles, MD ? ? PT End of Session - 04/10/21 0953   ? ? Visit Number 2   ? Number of Visits 12   ? Date for PT Re-Evaluation 06/26/21   ? Authorization Type IE 04/03/2021   ? PT Start Time 0945   ? PT Stop Time 1025   ? PT Time Calculation (min) 40 min   ? Activity Tolerance Patient tolerated treatment well   ? Behavior During Therapy [facility] for tasks assessed/performed   ? ?  ?  ? ?  ? ? ?Past Medical History:  ?Diagnosis Date  ? Anxiety   ? GERD (gastroesophageal reflux disease)   ? Hypercholesteremia   ? MS (multiple sclerosis) (HCC)   ? ?History reviewed. No pertinent surgical history. ?Patient Active Problem List  ? Diagnosis Date Noted  ? Gastro-esophageal reflux 03/11/2021  ? Hyperacidity 03/11/2021  ? Nocturia more than twice per night 03/11/2021  ? Overactive bladder 03/11/2021  ? Seborrheic dermatitis 03/11/2021  ? 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 Multiple sclerosis (HCC) 11/30/2020  ? Seasonal allergies 11/30/2020  ? ? ?REFERRING DIAG: N32.81 (ICD-10-CM) - Overactive bladder ? ?THERAPY DIAG:  ?Other lack of coordination ? ?Other muscle spasm ? ?Muscle weakness (generalized) ? ?PERTINENT HISTORY: MS ? ?PRECAUTIONS: Falls ? ?SUBJECTIVE: Patient reports that she is having a pretty good week. She did have a near fall yesterday but was able to regain her balance with external support from her husband. Patient reports that she has noticed that when she is on her cycle her urinary symptoms are worse. This past week was not a bad week because she was finished with her cycle; presents bladder diary that shows some definite frequency of urination with limited output.  ? ?PAIN:  ?Are you having pain? Yes: NPRS scale: 2/10 ?Pain location: bladder ? ?TREATMENT ? ?Pre-treatment assessment: ?RANGE OF  MOTION:  ?  LEFT RIGHT  ?Lumbar extension (30): WNL    ?Lumbar lateral flexion (25):  WNL WNL  ?Thoracic and Lumbar rotation (30 degrees):    WNL WNL  ?Hip Flexion (0-125):   WNL WNL  ?Hip IR (0-45):  WNL WNL  ?Hip ER (0-45):  WNL WNL  ?Hip Abduction (0-40):  WNL WNL  ?Hip extension (0-15):  WNL WNL  ? ? ?SENSATION: ?Grossly intact to light touch bilateral LEs as determined by testing dermatomes L2-S2 ?Proprioception and hot/cold testing deferred on this date ? ?STRENGTH: MMT  ? RLE LLE  ?Hip Flexion 4 4  ?Hip Abduction  4 4  ?Hip Adduction  4 4  ?Hip ER  4 4  ?Hip IR  4 4  ? ?ABDOMINAL:  ?Palpation: no TTP ?Diastasis: none noted on testing ?Scar mobility:n/a ?Rib flare: none noted ? ?SPECIAL TESTS: ?SLR (SN 92, -LR 0.29): R: Negative L:  Negative ? ?FABER (SN 81): R: Negative L: Negative ?FADIR (SN 94): R: Negative L: Negative ? ? ?EXTERNAL PELVIC EXAM: Patient educated on the purpose of the pelvic exam and articulated understanding; patient consented to the exam verbally. ?Breath coordination: present minimally, inconsistently ?Cued Lengthen: perineal descent ?Cued Contraction: 1/5 MMT ?Cue Relaxation: delayed ?Cough: perineal descent ? ?Manual Therapy: ?Colon massage with patient education on GI anatomy and typical function. Patient provided with handout for home practice.  ? ?Neuromuscular Re-education: ?Supine hooklying diaphragmatic breathing with VCs and  TCs for downregulation of the nervous system and improved management of IAP ?Supine hooklying, PFM lengthening with inhalation. VCs and TCs to decrease compensatory patterns and encourage optimal relaxation of the PFM. ? ? ?Therapeutic Exercise: ? ? ?Treatments unbilled: ? ?Post-treatment assessment: ? ?Patient educated throughout session on appropriate technique and form using multi-modal cueing, HEP, and activity modification. Patient articulated understanding and returned demonstration. ? ?Patient Response to interventions: ?Notes comfort with bowel  massage. ? ?ASSESSMENT ?Patient presents to clinic with excellent motivation to participate in therapy. Patient demonstrates deficits in PFM coordination, bladder habits, posture, PFM strength, and pain. Patient able to achieve PFM length with coordinated breath and cue to gently bear down during today's session and responded positively to manual interventions. Patient will benefit from continued skilled therapeutic intervention to address remaining deficits in PFM coordination, bladder habits, posture, PFM strength, and pain in order to increase function and improve overall QOL. ? ? PT Long Term Goals  ? ?  ? PT LONG TERM GOAL #1  ? Title Patient will demonstrate independence with HEP in order to maximize therapeutic gains and improve carryover from physical therapy sessions to ADLs in the home and community.   ? Baseline not initiated   ? Time 12   ? Period Weeks   ? Status New   ? Target Date 06/26/21   ?  ? PT LONG TERM GOAL #2  ? Title Patient will demonstrate independent and coordinated diaphragmatic breathing in supine with a 1:2 breathing pattern for improved down-regulation of the nervous system and improved management of intra-abdominal pressures in order to increase function at home and in the community.   ? Baseline not demonstrated   ? Time 12   ? Period Weeks   ? Status New   ? Target Date 06/26/21   ?  ? PT LONG TERM GOAL #3  ? Title Patient will demonstrate improved function as evidenced by a score of 59 on FOTO Urinary measure for full participation in activities at home and in the community.   ? Baseline 46   ? Time 12   ? Period Weeks   ? Status New   ? Target Date 06/26/21   ?  ? PT LONG TERM GOAL #4  ? Title Patient will demonstrate improved distress as evidenced by a score of <30 on PFDI Urinary measure for full participation in activities at home and in the community.   ? Baseline 50   ? Time 12   ? Period Weeks   ? Status New   ? Target Date 06/26/21   ?  ? PT LONG TERM GOAL #5  ? Title  Patient will demonstrate coordinated lengthening and relaxation of PFM with diaphragmatic inhalation in order to decrease spasm and allow for unrestricted elimination of urine/feces for improved overall QOL.   ? Baseline not demonstrated   ? Time 12   ? Period Weeks   ? Status New   ? Target Date 06/26/21   ? ?  ?  ? ?  ? ? Plan   ? ? Clinical Impression Statement Patient presents to clinic with excellent motivation to participate in therapy. Patient demonstrates deficits in PFM coordination, bladder habits, posture, PFM strength, and pain. Patient able to achieve PFM length with coordinated breath and cue to gently bear down during today's session and responded positively to manual interventions. Patient will benefit from continued skilled therapeutic intervention to address remaining deficits in PFM coordination, bladder habits, posture, PFM strength, and pain  in order to increase function and improve overall QOL.   ? Personal Factors and Comorbidities Age;Behavior Pattern;Comorbidity 3+;Past/Current Experience;Time since onset of injury/illness/exacerbation   ? Comorbidities anxiety, GERD, MS, hypercholesteremia, abnormal LFTs, Hepatitis B core antibody positive   ? Examination-Activity Limitations Sleep;Continence;Toileting   ? Examination-Participation Restrictions Interpersonal Relationship;Community Activity;Occupation   ? Stability/Clinical Decision Making Evolving/Moderate complexity   ? Rehab Potential Fair   ? PT Frequency 1x / week   ? PT Duration 12 weeks   ? PT Treatment/Interventions ADLs/Self Care Home Management;Biofeedback;Cryotherapy;Electrical Stimulation;Moist Heat;Therapeutic activities;Therapeutic exercise;Neuromuscular re-education;Patient/family education;Orthotic Fit/Training;Manual techniques;Taping;Joint Manipulations;Dry needling   ? PT Next Visit Plan physical assessment   ? PT Home Exercise Plan not initiated   ? Consulted and Agree with Plan of Care Patient   ? ?  ?  ? ?   ? ? ? ?Sheria Lang PT, DPT [phone]  ?04/10/2021, 11:42 AM ? ?  ? ?

## 2021-04-17 ENCOUNTER — Encounter: Payer: Self-pay | Admitting: Physical Therapy

## 2021-04-17 ENCOUNTER — Ambulatory Visit: Payer: BC Managed Care – PPO | Attending: Obstetrics and Gynecology | Admitting: Physical Therapy

## 2021-04-17 DIAGNOSIS — M6281 Muscle weakness (generalized): Secondary | ICD-10-CM | POA: Insufficient documentation

## 2021-04-17 DIAGNOSIS — M62838 Other muscle spasm: Secondary | ICD-10-CM | POA: Insufficient documentation

## 2021-04-17 DIAGNOSIS — R278 Other lack of coordination: Secondary | ICD-10-CM | POA: Diagnosis present

## 2021-04-17 NOTE — Therapy (Signed)
?OUTPATIENT PHYSICAL THERAPY TREATMENT NOTE ? ? ?Patient Name: Allison Moon ?MRN: [id number] ?DOB:December 20, 1990, 31 y.o., female ?Today's Date: 04/17/2021 ? ?PCP: Berniece Salines, FNP ?REFERRING PROVIDER: Westley Gambles, MD ? ? PT End of Session - 04/17/21 1420   ? ? Visit Number 3   ? Number of Visits 12   ? Date for PT Re-Evaluation 06/26/21   ? Authorization Type IE 04/03/2021   ? PT Start Time 1420   ? PT Stop Time 1500   ? PT Time Calculation (min) 40 min   ? Activity Tolerance Patient tolerated treatment well   ? Behavior During Therapy [facility] for tasks assessed/performed   ? ?  ?  ? ?  ? ? ?Past Medical History:  ?Diagnosis Date  ? Anxiety   ? GERD (gastroesophageal reflux disease)   ? Hypercholesteremia   ? MS (multiple sclerosis) (HCC)   ? ?History reviewed. No pertinent surgical history. ?Patient Active Problem List  ? Diagnosis Date Noted  ? Gastro-esophageal reflux 03/11/2021  ? Hyperacidity 03/11/2021  ? Nocturia more than twice per night 03/11/2021  ? Overactive bladder 03/11/2021  ? Seborrheic dermatitis 03/11/2021  ? 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 Multiple sclerosis (HCC) 11/30/2020  ? Seasonal allergies 11/30/2020  ? ? ?REFERRING DIAG: N32.81 (ICD-10-CM) - Overactive bladder ? ?THERAPY DIAG:  ?Other lack of coordination ? ?Other muscle spasm ? ?Muscle weakness (generalized) ? ?PERTINENT HISTORY: MS ? ?PRECAUTIONS: Falls ? ?SUBJECTIVE: Patient states that she has had a bad week with balance and concentration. Patient notes she has been using SPC more for gait stability. Patient had instance of intense urinary urge with limited bathroom access and then once able to get out of car, she had no urge. Patient did have a small amount of UI.  ? ?PAIN:  ?Are you having pain? Yes: NPRS scale: 3/10 ?Pain location: abdominal ? ?TREATMENT ? ?Manual Therapy: ?Patient education on lymphatic stimulation as a pain modulation strategy for bladder spasm or bowel discomfort. Provided with handout; will  follow-up in clinic with demonstration at next visit. ? ?Neuromuscular Re-education: ?Patient educated extensively on typical bladder function, typical bladder habits, basics of urge suppression, bladder irritants in order to better regulate bladder through behavioral changes.  ?Patient provided with home stretching program for decreased PFM spasm including: knee to chest, happy baby, and supine butterfly. ? ? ?Therapeutic Exercise: ? ? ?Treatments unbilled: ? ?Post-treatment assessment: ? ?Patient educated throughout session on appropriate technique and form using multi-modal cueing, HEP, and activity modification. Patient articulated understanding and returned demonstration. ? ?Patient Response to interventions: ?Comfortable to trial stretches.  ? ? ? ? PT Long Term Goals  ? ?  ? PT LONG TERM GOAL #1  ? Title Patient will demonstrate independence with HEP in order to maximize therapeutic gains and improve carryover from physical therapy sessions to ADLs in the home and community.   ? Baseline not initiated   ? Time 12   ? Period Weeks   ? Status New   ? Target Date 06/26/21   ?  ? PT LONG TERM GOAL #2  ? Title Patient will demonstrate independent and coordinated diaphragmatic breathing in supine with a 1:2 breathing pattern for improved down-regulation of the nervous system and improved management of intra-abdominal pressures in order to increase function at home and in the community.   ? Baseline not demonstrated   ? Time 12   ? Period Weeks   ? Status New   ? Target Date  06/26/21   ?  ? PT LONG TERM GOAL #3  ? Title Patient will demonstrate improved function as evidenced by a score of 59 on FOTO Urinary measure for full participation in activities at home and in the community.   ? Baseline 46   ? Time 12   ? Period Weeks   ? Status New   ? Target Date 06/26/21   ?  ? PT LONG TERM GOAL #4  ? Title Patient will demonstrate improved distress as evidenced by a score of <30 on PFDI Urinary measure for full  participation in activities at home and in the community.   ? Baseline 50   ? Time 12   ? Period Weeks   ? Status New   ? Target Date 06/26/21   ?  ? PT LONG TERM GOAL #5  ? Title Patient will demonstrate coordinated lengthening and relaxation of PFM with diaphragmatic inhalation in order to decrease spasm and allow for unrestricted elimination of urine/feces for improved overall QOL.   ? Baseline not demonstrated   ? Time 12   ? Period Weeks   ? Status New   ? Target Date 06/26/21   ? ?  ?  ? ?  ? ? Plan   ? ? Clinical Impression Statement Patient presents to clinic with excellent motivation to participate in therapy. Patient demonstrates deficits in PFM coordination, bladder habits, posture, PFM strength, and pain. Patient receptive to education on bladder habits and urge suppression during today's session and responded positively to all educational interventions. Patient will benefit from continued skilled therapeutic intervention to address remaining deficits in PFM coordination, bladder habits, posture, PFM strength, and pain in order to increase function and improve overall QOL.   ? Personal Factors and Comorbidities Age;Behavior Pattern;Comorbidity 3+;Past/Current Experience;Time since onset of injury/illness/exacerbation   ? Comorbidities anxiety, GERD, MS, hypercholesteremia, abnormal LFTs, Hepatitis B core antibody positive   ? Examination-Activity Limitations Sleep;Continence;Toileting   ? Examination-Participation Restrictions Interpersonal Relationship;Community Activity;Occupation   ? Stability/Clinical Decision Making Evolving/Moderate complexity   ? Rehab Potential Fair   ? PT Frequency 1x / week   ? PT Duration 12 weeks   ? PT Treatment/Interventions ADLs/Self Care Home Management;Biofeedback;Cryotherapy;Electrical Stimulation;Moist Heat;Therapeutic activities;Therapeutic exercise;Neuromuscular re-education;Patient/family education;Orthotic Fit/Training;Manual techniques;Taping;Joint  Manipulations;Dry needling   ? PT Next Visit Plan   ? PT Home Exercise Plan   ? Consulted and Agree with Plan of Care Patient   ? ?  ?  ? ?  ? ? ? Sheria Lang PT, DPT [phone]  ?04/17/2021, 2:21 PM ? ?  ? ?

## 2021-04-24 ENCOUNTER — Ambulatory Visit: Payer: BC Managed Care – PPO | Admitting: Physical Therapy

## 2021-04-24 ENCOUNTER — Encounter: Payer: Self-pay | Admitting: Physical Therapy

## 2021-04-24 DIAGNOSIS — M62838 Other muscle spasm: Secondary | ICD-10-CM

## 2021-04-24 DIAGNOSIS — R278 Other lack of coordination: Secondary | ICD-10-CM | POA: Diagnosis not present

## 2021-04-24 DIAGNOSIS — M6281 Muscle weakness (generalized): Secondary | ICD-10-CM

## 2021-04-24 NOTE — Therapy (Signed)
?OUTPATIENT PHYSICAL THERAPY TREATMENT NOTE ? ? ?Patient Name: Allison Moon ?MRN: [id number] ?DOB:07-18-1990, 31 y.o., female ?Today's Date: 04/24/2021 ? ?PCP: Berniece Salines, FNP ?REFERRING PROVIDER: Westley Gambles, MD ? ? PT End of Session - 04/24/21 1504   ? ? Visit Number 4   ? Number of Visits 12   ? Date for PT Re-Evaluation 06/26/21   ? Authorization Type IE 04/03/2021   ? PT Start Time 1500   ? PT Stop Time 1540   ? PT Time Calculation (min) 40 min   ? Activity Tolerance Patient tolerated treatment well   ? Behavior During Therapy [facility] for tasks assessed/performed   ? ?  ?  ? ?  ? ? ?Past Medical History:  ?Diagnosis Date  ? Anxiety   ? GERD (gastroesophageal reflux disease)   ? Hypercholesteremia   ? MS (multiple sclerosis) (HCC)   ? ?History reviewed. No pertinent surgical history. ?Patient Active Problem List  ? Diagnosis Date Noted  ? Gastro-esophageal reflux 03/11/2021  ? Hyperacidity 03/11/2021  ? Nocturia more than twice per night 03/11/2021  ? Overactive bladder 03/11/2021  ? Seborrheic dermatitis 03/11/2021  ? 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 Multiple sclerosis (HCC) 11/30/2020  ? Seasonal allergies 11/30/2020  ? ? ?REFERRING DIAG: N32.81 (ICD-10-CM) - Overactive bladder ? ?THERAPY DIAG:  ?Other lack of coordination ? ?Other muscle spasm ? ?Muscle weakness (generalized) ? ?PERTINENT HISTORY: MS ? ?PRECAUTIONS: Falls ? ?SUBJECTIVE: Patient reports that this week is a better week. She is caught up on sleep. Patient states that she has had increased frequency until midnight; patient attempts to be in bed by 930P-10P. Patient notes that she is able to stay asleep until about 3A and then again 6-7A. Patient had increased success with BMs (Type 3) when doing massage combined with increased water intake, but when water intake fell off, BMs went back to Type 1. Patient reports that the stretches provoke anterior hip/groin pain. Patient has been practicing PFM lengthening.  ? ?PAIN:  ?Are you  having pain? Yes: NPRS scale: 6/10 ?Pain location: headache, neck, hips ? ?TREATMENT ? ?Manual Therapy: ? ? ?Neuromuscular Re-education: ?Patient performed following interventions for improved management of PFM spasm, all performed with long duration hold 2-3 minutes, B:  ?- Supine Butterfly Groin Stretch  ?- Supine Hip External Rotation Stretch  ?- Modified Thomas Stretch  ? ? ? ?Therapeutic Exercise: ? ? ?Treatments unbilled: ?Cryotherapy to head and neck for management of headache ?Post-treatment assessment: ? ?Patient educated throughout session on appropriate technique and form using multi-modal cueing, HEP, and activity modification. Patient articulated understanding and returned demonstration. ? ?Patient Response to interventions: ?"I feel much better" ? ? ? ? PT Long Term Goals  ? ?  ? PT LONG TERM GOAL #1  ? Title Patient will demonstrate independence with HEP in order to maximize therapeutic gains and improve carryover from physical therapy sessions to ADLs in the home and community.   ? Baseline not initiated   ? Time 12   ? Period Weeks   ? Status New   ? Target Date 06/26/21   ?  ? PT LONG TERM GOAL #2  ? Title Patient will demonstrate independent and coordinated diaphragmatic breathing in supine with a 1:2 breathing pattern for improved down-regulation of the nervous system and improved management of intra-abdominal pressures in order to increase function at home and in the community.   ? Baseline not demonstrated   ? Time 12   ?  Period Weeks   ? Status New   ? Target Date 06/26/21   ?  ? PT LONG TERM GOAL #3  ? Title Patient will demonstrate improved function as evidenced by a score of 59 on FOTO Urinary measure for full participation in activities at home and in the community.   ? Baseline 46   ? Time 12   ? Period Weeks   ? Status New   ? Target Date 06/26/21   ?  ? PT LONG TERM GOAL #4  ? Title Patient will demonstrate improved distress as evidenced by a score of <30 on PFDI Urinary measure for  full participation in activities at home and in the community.   ? Baseline 50   ? Time 12   ? Period Weeks   ? Status New   ? Target Date 06/26/21   ?  ? PT LONG TERM GOAL #5  ? Title Patient will demonstrate coordinated lengthening and relaxation of PFM with diaphragmatic inhalation in order to decrease spasm and allow for unrestricted elimination of urine/feces for improved overall QOL.   ? Baseline not demonstrated   ? Time 12   ? Period Weeks   ? Status New   ? Target Date 06/26/21   ? ?  ?  ? ?  ? ? Plan   ? ? Clinical Impression Statement Patient presents to clinic with excellent motivation to participate in therapy. Patient demonstrates deficits in PFM coordination, bladder habits, posture, PFM strength, and pain. Patient noting improved pain at end of new stretching sequence during today's session and responded positively to all interventions. Patient will benefit from continued skilled therapeutic intervention to address remaining deficits in PFM coordination, bladder habits, posture, PFM strength, and pain in order to increase function and improve overall QOL.   ? Personal Factors and Comorbidities Age;Behavior Pattern;Comorbidity 3+;Past/Current Experience;Time since onset of injury/illness/exacerbation   ? Comorbidities anxiety, GERD, MS, hypercholesteremia, abnormal LFTs, Hepatitis B core antibody positive   ? Examination-Activity Limitations Sleep;Continence;Toileting   ? Examination-Participation Restrictions Interpersonal Relationship;Community Activity;Occupation   ? Stability/Clinical Decision Making Evolving/Moderate complexity   ? Rehab Potential Fair   ? PT Frequency 1x / week   ? PT Duration 12 weeks   ? PT Treatment/Interventions ADLs/Self Care Home Management;Biofeedback;Cryotherapy;Electrical Stimulation;Moist Heat;Therapeutic activities;Therapeutic exercise;Neuromuscular re-education;Patient/family education;Orthotic Fit/Training;Manual techniques;Taping;Joint Manipulations;Dry needling    ? PT Next Visit Plan   ? PT Home Exercise Plan [id number]  ? Consulted and Agree with Plan of Care Patient   ? ?  ?  ? ?  ? ? ? Sheria Lang PT, DPT [phone]  ?04/24/2021, 3:05 PM ? ?  ? ?

## 2021-05-01 ENCOUNTER — Encounter: Payer: Self-pay | Admitting: Physical Therapy

## 2021-05-01 ENCOUNTER — Ambulatory Visit: Payer: BC Managed Care – PPO | Admitting: Physical Therapy

## 2021-05-01 DIAGNOSIS — R278 Other lack of coordination: Secondary | ICD-10-CM

## 2021-05-01 DIAGNOSIS — M6281 Muscle weakness (generalized): Secondary | ICD-10-CM

## 2021-05-01 DIAGNOSIS — M62838 Other muscle spasm: Secondary | ICD-10-CM

## 2021-05-01 NOTE — Therapy (Signed)
?OUTPATIENT PHYSICAL THERAPY TREATMENT NOTE ? ? ?Patient Name: Allison Moon ?MRN: [id number] ?DOB:[birth year], 31 y.o., female ?Today's Date: 05/01/2021 ? ?PCP: Berniece Salines, FNP ?REFERRING PROVIDER: Westley Gambles, MD ? ? PT End of Session - 05/01/21 1434   ? ? Visit Number 5   ? Number of Visits 12   ? Date for PT Re-Evaluation 06/26/21   ? Authorization Type IE 04/03/2021   ? PT Start Time 1430   ? PT Stop Time 1510   ? PT Time Calculation (min) 40 min   ? Activity Tolerance Patient tolerated treatment well   ? Behavior During Therapy [facility] for tasks assessed/performed   ? ?  ?  ? ?  ? ? ?Past Medical History:  ?Diagnosis Date  ? Anxiety   ? GERD (gastroesophageal reflux disease)   ? Hypercholesteremia   ? MS (multiple sclerosis) (HCC)   ? ?History reviewed. No pertinent surgical history. ?Patient Active Problem List  ? Diagnosis Date Noted  ? Gastro-esophageal reflux 03/11/2021  ? Hyperacidity 03/11/2021  ? Nocturia more than twice per night 03/11/2021  ? Overactive bladder 03/11/2021  ? Seborrheic dermatitis 03/11/2021  ? 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 Multiple sclerosis (HCC) 11/30/2020  ? Seasonal allergies 11/30/2020  ? ? ?REFERRING DIAG: N32.81 (ICD-10-CM) - Overactive bladder ? ?THERAPY DIAG:  ?Other lack of coordination ? ?Other muscle spasm ? ?Muscle weakness (generalized) ? ?PERTINENT HISTORY: MS ? ?PRECAUTIONS: Falls ? ?SUBJECTIVE: Patient states that she is having increased fatigue but is also unable to sleep. Patient reports that she has been having more frequent BMs but still Type 1-2. Patient has increased water intake and as a result has had increased UI. However, she is leaking 25% of the bladder contents. Patient continues to have nocturia every 20 minutes until around 1A. Patient adds that she has been having increased MS symptoms this week as well.  ? ?PAIN:  ?Are you having pain? Yes: NPRS scale:  /10 ?Pain location:   ? ?TREATMENT ? ?Manual Therapy: ? ? ?Neuromuscular  Re-education: ?Trial of PTNS transcutaneous approach in clinic. LLE medial lower leg. Patient educated on mechanism of action and bladder irritants during treatment.  ?Patient education on neurogenic bladder.  ? ? ?Therapeutic Exercise: ? ? ?Treatments unbilled: ? ?Post-treatment assessment: ? ?Patient educated throughout session on appropriate technique and form using multi-modal cueing, HEP, and activity modification. Patient articulated understanding and returned demonstration. ? ?Patient Response to interventions: ?Comfortable to return in 1 week ? ? ? ? PT Long Term Goals  ? ?  ? PT LONG TERM GOAL #1  ? Title Patient will demonstrate independence with HEP in order to maximize therapeutic gains and improve carryover from physical therapy sessions to ADLs in the home and community.   ? Baseline not initiated   ? Time 12   ? Period Weeks   ? Status New   ? Target Date 06/26/21   ?  ? PT LONG TERM GOAL #2  ? Title Patient will demonstrate independent and coordinated diaphragmatic breathing in supine with a 1:2 breathing pattern for improved down-regulation of the nervous system and improved management of intra-abdominal pressures in order to increase function at home and in the community.   ? Baseline not demonstrated   ? Time 12   ? Period Weeks   ? Status New   ? Target Date 06/26/21   ?  ? PT LONG TERM GOAL #3  ? Title Patient will demonstrate improved function as evidenced  by a score of 59 on FOTO Urinary measure for full participation in activities at home and in the community.   ? Baseline 46   ? Time 12   ? Period Weeks   ? Status New   ? Target Date 06/26/21   ?  ? PT LONG TERM GOAL #4  ? Title Patient will demonstrate improved distress as evidenced by a score of <30 on PFDI Urinary measure for full participation in activities at home and in the community.   ? Baseline 50   ? Time 12   ? Period Weeks   ? Status New   ? Target Date 06/26/21   ?  ? PT LONG TERM GOAL #5  ? Title Patient will demonstrate  coordinated lengthening and relaxation of PFM with diaphragmatic inhalation in order to decrease spasm and allow for unrestricted elimination of urine/feces for improved overall QOL.   ? Baseline not demonstrated   ? Time 12   ? Period Weeks   ? Status New   ? Target Date 06/26/21   ? ?  ?  ? ?  ? ? Plan   ? ? Clinical Impression Statement Patient presents to clinic with excellent motivation to participate in therapy. Patient demonstrates deficits in PFM coordination, bladder habits, posture, PFM strength, and pain. Patient interested in PTNS and tolerated trial treatment during today's session and responded positively to educational interventions. Patient will benefit from continued skilled therapeutic intervention to address remaining deficits in PFM coordination, bladder habits, posture, PFM strength, and pain in order to increase function and improve overall QOL.   ? Personal Factors and Comorbidities Age;Behavior Pattern;Comorbidity 3+;Past/Current Experience;Time since onset of injury/illness/exacerbation   ? Comorbidities anxiety, GERD, MS, hypercholesteremia, abnormal LFTs, Hepatitis B core antibody positive   ? Examination-Activity Limitations Sleep;Continence;Toileting   ? Examination-Participation Restrictions Interpersonal Relationship;Community Activity;Occupation   ? Stability/Clinical Decision Making Evolving/Moderate complexity   ? Rehab Potential Fair   ? PT Frequency 1x / week   ? PT Duration 12 weeks   ? PT Treatment/Interventions ADLs/Self Care Home Management;Biofeedback;Cryotherapy;Electrical Stimulation;Moist Heat;Therapeutic activities;Therapeutic exercise;Neuromuscular re-education;Patient/family education;Orthotic Fit/Training;Manual techniques;Taping;Joint Manipulations;Dry needling   ? PT Next Visit Plan   ? PT Home Exercise Plan [id number]  ? Consulted and Agree with Plan of Care Patient   ? ?  ?  ? ?  ? ? ? ?Sheria Lang PT, DPT [phone]  ?05/01/2021, 2:35 PM ? ?  ? ?

## 2021-05-08 ENCOUNTER — Ambulatory Visit: Payer: BC Managed Care – PPO | Admitting: Physical Therapy

## 2021-05-08 ENCOUNTER — Encounter: Payer: Self-pay | Admitting: Physical Therapy

## 2021-05-08 DIAGNOSIS — M6281 Muscle weakness (generalized): Secondary | ICD-10-CM

## 2021-05-08 DIAGNOSIS — R278 Other lack of coordination: Secondary | ICD-10-CM | POA: Diagnosis not present

## 2021-05-08 DIAGNOSIS — M62838 Other muscle spasm: Secondary | ICD-10-CM

## 2021-05-08 NOTE — Therapy (Signed)
?OUTPATIENT PHYSICAL THERAPY TREATMENT NOTE ? ? ?Patient Name: Oceania Noori ?MRN: [id number] ?DOB:1990-06-21, 31 y.o., female ?Today's Date: 05/08/2021 ? ?PCP: Berniece Salines, FNP ?REFERRING PROVIDER: Westley Gambles, MD ? ? PT End of Session - 05/08/21 1507   ? ? Visit Number 6   ? Number of Visits 12   ? Date for PT Re-Evaluation 06/26/21   ? Authorization Type IE 04/03/2021   ? PT Start Time 1500   ? PT Stop Time 1540   ? PT Time Calculation (min) 40 min   ? Activity Tolerance Patient tolerated treatment well   ? Behavior During Therapy [facility] for tasks assessed/performed   ? ?  ?  ? ?  ? ? ?Past Medical History:  ?Diagnosis Date  ? Anxiety   ? GERD (gastroesophageal reflux disease)   ? Hypercholesteremia   ? MS (multiple sclerosis) (HCC)   ? ?History reviewed. No pertinent surgical history. ?Patient Active Problem List  ? Diagnosis Date Noted  ? Gastro-esophageal reflux 03/11/2021  ? Hyperacidity 03/11/2021  ? Nocturia more than twice per night 03/11/2021  ? Overactive bladder 03/11/2021  ? Seborrheic dermatitis 03/11/2021  ? 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 Multiple sclerosis (HCC) 11/30/2020  ? Seasonal allergies 11/30/2020  ? ? ?REFERRING DIAG: N32.81 (ICD-10-CM) - Overactive bladder ? ?THERAPY DIAG:  ?Other lack of coordination ? ?Other muscle spasm ? ?Muscle weakness (generalized) ? ?PERTINENT HISTORY: MS ? ?PRECAUTIONS: Falls ? ?SUBJECTIVE: Patient reports while fatigue and pain are improved a bit. Patient notes that her urinary symptoms are worse with increased urgency. Patient noting leakage presents predominantly on L which is her symptomatic side. Patient does note the bright side is she is able to store 5-6 oz of urine each time.  ? ?PAIN:  ?Are you having pain? Yes: NPRS scale: "a little tightness" /10 ?Pain location: lower abdominal ? ?TREATMENT ? ?Manual Therapy: ? ? ?Neuromuscular Re-education: ?Sidelying diaphragmatic breathing with VCs and TCs for downregulation of the nervous system  and improved management of IAP ?Sidelying, PFM contractions with exhalation. VCs and TCs to decrease compensatory patterns and encourage activation of the PFM. ? ? ? ?Therapeutic Exercise: ? ? ?Treatments unbilled: ? ?Post-treatment assessment: ? ?Patient educated throughout session on appropriate technique and form using multi-modal cueing, HEP, and activity modification. Patient articulated understanding and returned demonstration. ? ?Patient Response to interventions: ?Comfortable to return in 1 week ? ? ? ? PT Long Term Goals  ? ?  ? PT LONG TERM GOAL #1  ? Title Patient will demonstrate independence with HEP in order to maximize therapeutic gains and improve carryover from physical therapy sessions to ADLs in the home and community.   ? Baseline not initiated   ? Time 12   ? Period Weeks   ? Status New   ? Target Date 06/26/21   ?  ? PT LONG TERM GOAL #2  ? Title Patient will demonstrate independent and coordinated diaphragmatic breathing in supine with a 1:2 breathing pattern for improved down-regulation of the nervous system and improved management of intra-abdominal pressures in order to increase function at home and in the community.   ? Baseline not demonstrated   ? Time 12   ? Period Weeks   ? Status New   ? Target Date 06/26/21   ?  ? PT LONG TERM GOAL #3  ? Title Patient will demonstrate improved function as evidenced by a score of 59 on FOTO Urinary measure for full participation in activities  at home and in the community.   ? Baseline 46   ? Time 12   ? Period Weeks   ? Status New   ? Target Date 06/26/21   ?  ? PT LONG TERM GOAL #4  ? Title Patient will demonstrate improved distress as evidenced by a score of <30 on PFDI Urinary measure for full participation in activities at home and in the community.   ? Baseline 50   ? Time 12   ? Period Weeks   ? Status New   ? Target Date 06/26/21   ?  ? PT LONG TERM GOAL #5  ? Title Patient will demonstrate coordinated lengthening and relaxation of PFM with  diaphragmatic inhalation in order to decrease spasm and allow for unrestricted elimination of urine/feces for improved overall QOL.   ? Baseline not demonstrated   ? Time 12   ? Period Weeks   ? Status New   ? Target Date 06/26/21   ? ?  ?  ? ?  ? ? Plan   ? ? Clinical Impression Statement Patient presents to clinic with excellent motivation to participate in therapy. Patient demonstrates deficits in PFM coordination, bladder habits, posture, PFM strength, and pain. Patient required increased time between repetitions of PFM contractions but was able to perform ~5 repetitions of consistent quality without compensation during today's session and responded positively to active interventions. Patient will benefit from continued skilled therapeutic intervention to address remaining deficits in PFM coordination, bladder habits, posture, PFM strength, and pain in order to increase function and improve overall QOL.   ? Personal Factors and Comorbidities Age;Behavior Pattern;Comorbidity 3+;Past/Current Experience;Time since onset of injury/illness/exacerbation   ? Comorbidities anxiety, GERD, MS, hypercholesteremia, abnormal LFTs, Hepatitis B core antibody positive   ? Examination-Activity Limitations Sleep;Continence;Toileting   ? Examination-Participation Restrictions Interpersonal Relationship;Community Activity;Occupation   ? Stability/Clinical Decision Making Evolving/Moderate complexity   ? Rehab Potential Fair   ? PT Frequency 1x / week   ? PT Duration 12 weeks   ? PT Treatment/Interventions ADLs/Self Care Home Management;Biofeedback;Cryotherapy;Electrical Stimulation;Moist Heat;Therapeutic activities;Therapeutic exercise;Neuromuscular re-education;Patient/family education;Orthotic Fit/Training;Manual techniques;Taping;Joint Manipulations;Dry needling   ? PT Next Visit Plan   ? PT Home Exercise Plan [id number]  ? Consulted and Agree with Plan of Care Patient   ? ?  ?  ? ?  ? ? ? Sheria Lang PT, DPT [phone]   ?05/08/2021, 3:08 PM ? ?  ? ?

## 2021-05-16 ENCOUNTER — Ambulatory Visit: Payer: BC Managed Care – PPO | Attending: Obstetrics and Gynecology | Admitting: Physical Therapy

## 2021-05-16 ENCOUNTER — Encounter: Payer: Self-pay | Admitting: Physical Therapy

## 2021-05-16 DIAGNOSIS — M6281 Muscle weakness (generalized): Secondary | ICD-10-CM | POA: Diagnosis present

## 2021-05-16 DIAGNOSIS — M62838 Other muscle spasm: Secondary | ICD-10-CM | POA: Insufficient documentation

## 2021-05-16 DIAGNOSIS — R278 Other lack of coordination: Secondary | ICD-10-CM | POA: Diagnosis present

## 2021-05-16 NOTE — Therapy (Signed)
?OUTPATIENT PHYSICAL THERAPY TREATMENT NOTE ? ? ?Patient Name: Allison Moon ?MRN: [id number] ?DOB:Apr 20, 1990, 31 y.o., female ?Today's Date: 05/16/2021 ? ?PCP: Berniece Salines, FNP ?REFERRING PROVIDER: Westley Gambles, MD ? ? PT End of Session - 05/16/21 1332   ? ? Visit Number 7   ? Number of Visits 12   ? Date for PT Re-Evaluation 06/26/21   ? Authorization Type IE 04/03/2021   ? PT Start Time 1330   ? PT Stop Time 1410   ? PT Time Calculation (min) 40 min   ? Activity Tolerance Patient tolerated treatment well   ? Behavior During Therapy [facility] for tasks assessed/performed   ? ?  ?  ? ?  ? ? ?Past Medical History:  ?Diagnosis Date  ? Anxiety   ? GERD (gastroesophageal reflux disease)   ? Hypercholesteremia   ? MS (multiple sclerosis) (HCC)   ? ?History reviewed. No pertinent surgical history. ?Patient Active Problem List  ? Diagnosis Date Noted  ? Gastro-esophageal reflux 03/11/2021  ? Hyperacidity 03/11/2021  ? Nocturia more than twice per night 03/11/2021  ? Overactive bladder 03/11/2021  ? Seborrheic dermatitis 03/11/2021  ? 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 Multiple sclerosis (HCC) 11/30/2020  ? Seasonal allergies 11/30/2020  ? ? ?REFERRING DIAG: N32.81 (ICD-10-CM) - Overactive bladder ? ?THERAPY DIAG:  ?Other lack of coordination ? ?Other muscle spasm ? ?Muscle weakness (generalized) ? ?PERTINENT HISTORY: MS ? ?PRECAUTIONS: Falls ? ?SUBJECTIVE: Patient states that she has had increased fruit intake over the past few days. Patient has voided > 7 oz for 3 voids in the past 2 hours. Patient notes that she was able to maintain stream for 10 seconds with good strength once over the past few days. Patient has been doing breathing and stretches.  ? ?PAIN:  ?Are you having pain? Yes: NPRS scale: "a little tightness" /10 ?Pain location: lower abdominal ? ?TREATMENT ? ?Manual Therapy: ? ? ?Neuromuscular Re-education: ?Patient education on PTNS with TENS unit for home use. Patient taught set-up and  precautions. Patient able to teach back set-up. Provided with handout for home use.  ?Reviewed HEP and encouraged to continue with PFM motor control interventions as well as interventions for spasm release. ? ?Therapeutic Exercise: ? ? ?Treatments unbilled: ? ?Post-treatment assessment: ? ?Patient educated throughout session on appropriate technique and form using multi-modal cueing, HEP, and activity modification. Patient articulated understanding and returned demonstration. ? ?Patient Response to interventions: ?Comfortable to return in 4 weeks ? ? ? ? PT Long Term Goals  ? ?  ? PT LONG TERM GOAL #1  ? Title Patient will demonstrate independence with HEP in order to maximize therapeutic gains and improve carryover from physical therapy sessions to ADLs in the home and community.   ? Baseline not initiated   ? Time 12   ? Period Weeks   ? Status New   ? Target Date 06/26/21   ?  ? PT LONG TERM GOAL #2  ? Title Patient will demonstrate independent and coordinated diaphragmatic breathing in supine with a 1:2 breathing pattern for improved down-regulation of the nervous system and improved management of intra-abdominal pressures in order to increase function at home and in the community.   ? Baseline not demonstrated   ? Time 12   ? Period Weeks   ? Status New   ? Target Date 06/26/21   ?  ? PT LONG TERM GOAL #3  ? Title Patient will demonstrate improved function as evidenced by  a score of 59 on FOTO Urinary measure for full participation in activities at home and in the community.   ? Baseline 46   ? Time 12   ? Period Weeks   ? Status New   ? Target Date 06/26/21   ?  ? PT LONG TERM GOAL #4  ? Title Patient will demonstrate improved distress as evidenced by a score of <30 on PFDI Urinary measure for full participation in activities at home and in the community.   ? Baseline 50   ? Time 12   ? Period Weeks   ? Status New   ? Target Date 06/26/21   ?  ? PT LONG TERM GOAL #5  ? Title Patient will demonstrate  coordinated lengthening and relaxation of PFM with diaphragmatic inhalation in order to decrease spasm and allow for unrestricted elimination of urine/feces for improved overall QOL.   ? Baseline not demonstrated   ? Time 12   ? Period Weeks   ? Status New   ? Target Date 06/26/21   ? ?  ?  ? ?  ? ? Plan   ? ? Clinical Impression Statement Patient presents to clinic with excellent motivation to participate in therapy. Patient demonstrates deficits in PFM coordination, bladder habits, posture, PFM strength, and pain. Patient able to teach back set-up of PTNS for home use during today's session and responded positively to educational interventions. Patient will benefit from continued skilled therapeutic intervention to address remaining deficits in PFM coordination, bladder habits, posture, PFM strength, and pain in order to increase function and improve overall QOL.   ? Personal Factors and Comorbidities Age;Behavior Pattern;Comorbidity 3+;Past/Current Experience;Time since onset of injury/illness/exacerbation   ? Comorbidities anxiety, GERD, MS, hypercholesteremia, abnormal LFTs, Hepatitis B core antibody positive   ? Examination-Activity Limitations Sleep;Continence;Toileting   ? Examination-Participation Restrictions Interpersonal Relationship;Community Activity;Occupation   ? Stability/Clinical Decision Making Evolving/Moderate complexity   ? Rehab Potential Fair   ? PT Frequency 1x / week   ? PT Duration 12 weeks   ? PT Treatment/Interventions ADLs/Self Care Home Management;Biofeedback;Cryotherapy;Electrical Stimulation;Moist Heat;Therapeutic activities;Therapeutic exercise;Neuromuscular re-education;Patient/family education;Orthotic Fit/Training;Manual techniques;Taping;Joint Manipulations;Dry needling   ? PT Next Visit Plan   ? PT Home Exercise Plan [id number]  ? Consulted and Agree with Plan of Care Patient   ? ?  ?  ? ?  ? ? ? Sheria Lang PT, DPT [phone]  ?05/16/2021, 3:54 PM ? ?  ? ?

## 2021-05-17 ENCOUNTER — Telehealth: Payer: Self-pay

## 2021-05-17 NOTE — Telephone Encounter (Signed)
Copied from CRM [id number]. Topic: General - Other >> May 17, 2021 11:55 AM McGill, Darlina Rumpf wrote: Reason for CRM: [name] from [facility] sent fax - 11:13 AM 05/17/21    For provider or nurse to fill out.  [phone]

## 2021-05-20 NOTE — Telephone Encounter (Signed)
[name] stated she received a notice saying to contact pt Neurology. [name] wants to know if this means PCP will not fill out forms.  ? ?[name], requesting a call back. ?

## 2021-05-20 NOTE — Telephone Encounter (Signed)
Left message for Allison Moon to have Neurologist fill out her paperwork that has to do with her MS. Case# [id number] ?

## 2021-05-31 LAB — HM HEPATITIS C SCREENING LAB: HM Hepatitis Screen: NEGATIVE

## 2021-06-13 ENCOUNTER — Encounter: Payer: Self-pay | Admitting: Nurse Practitioner

## 2021-06-20 ENCOUNTER — Encounter: Payer: Self-pay | Admitting: Physical Therapy

## 2021-06-26 ENCOUNTER — Ambulatory Visit: Payer: BC Managed Care – PPO | Attending: Obstetrics and Gynecology | Admitting: Physical Therapy

## 2021-06-26 ENCOUNTER — Encounter: Payer: Self-pay | Admitting: Physical Therapy

## 2021-06-26 DIAGNOSIS — M6281 Muscle weakness (generalized): Secondary | ICD-10-CM | POA: Diagnosis present

## 2021-06-26 DIAGNOSIS — R278 Other lack of coordination: Secondary | ICD-10-CM | POA: Diagnosis present

## 2021-06-26 DIAGNOSIS — M62838 Other muscle spasm: Secondary | ICD-10-CM | POA: Insufficient documentation

## 2021-06-26 NOTE — Therapy (Signed)
OUTPATIENT PHYSICAL THERAPY TREATMENT NOTE   Patient Name: Allison Moon MRN: [id number] DOB:08/15/1990, 31 y.o., female Today's Date: 06/26/2021  PCP: Bo Merino, FNP REFERRING PROVIDER: Raliegh Ip, MD   PT End of Session - 06/26/21 0956     Visit Number 8    Number of Visits 12    Date for PT Re-Evaluation 06/26/21    Authorization Type IE 04/03/2021    PT Start Time 0955    PT Stop Time 1025    PT Time Calculation (min) 30 min    Activity Tolerance Patient tolerated treatment well;Other (comment)   late arrival to clinic   Behavior During Therapy WFL for tasks assessed/performed             Past Medical History:  Diagnosis Date   Anxiety    GERD (gastroesophageal reflux disease)    Hypercholesteremia    MS (multiple sclerosis) ([place])    History reviewed. No pertinent surgical history. 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 Patient notes that she did feel it was helpful, but she is unsure if it actually helped or not. Patient continued to have UI dependent on phase of menstrual cycle, but reports this may have been lower volume. Patient states that she feels PFPT has been helpful overall as she has learned how to manage her symptoms and feels like she has developed more awareness. Patient states that she is ready to graduate today.   PAIN:  Are you having pain? Yes: NPRS scale: "a  little tightness" /10 Pain location: lower abdominal  TREATMENT  Manual Therapy:   Neuromuscular Re-education: Re-assessed goals; see below.  Patient education on dilator training with graded exposure approach for return to sexual activity.  Therapeutic Exercise:   Treatments unbilled:  Post-treatment assessment:  Patient educated throughout session on appropriate technique and form using multi-modal cueing, HEP, and activity modification. Patient articulated understanding and returned demonstration.  Patient Response to interventions: Comfortable to discharge to self-management   PT Long Term Goals - 06/26/21 0957       PT LONG TERM GOAL #1   Title Patient will demonstrate independence with HEP in order to maximize therapeutic gains and improve carryover from physical therapy sessions to ADLs in the home and community.    Baseline not initiated; 6/14: IND    Time 12    Period Weeks    Status Achieved    Target Date 06/26/21      PT LONG TERM GOAL #2   Title Patient will demonstrate independent and coordinated diaphragmatic breathing in supine with a 1:2 breathing pattern for improved down-regulation of the nervous system and improved management of intra-abdominal pressures in order to increase function at home and in the community.    Baseline not demonstrated; 6/14: IND    Time 12    Period Weeks    Status Achieved    Target Date 06/26/21      PT LONG TERM GOAL #3  Title Patient will demonstrate improved function as evidenced by a score of 59 on FOTO Urinary measure for full participation in activities at home and in the community.    Baseline 46; 6/14: 47    Time 12    Period Weeks    Status Not Met    Target Date 06/26/21      PT LONG TERM GOAL #4   Title Patient will demonstrate improved distress as evidenced by a score of <30 on PFDI Urinary measure for full participation in activities at home and in the community.    Baseline 50; 6/14: 54    Time 12     Period Weeks    Status Not Met    Target Date 06/26/21      PT LONG TERM GOAL #5   Title Patient will demonstrate coordinated lengthening and relaxation of PFM with diaphragmatic inhalation in order to decrease spasm and allow for unrestricted elimination of urine/feces for improved overall QOL.    Baseline not demonstrated; 6/14: IND    Time 12    Period Weeks    Status Achieved    Target Date 06/26/21                 Plan     Clinical Impression Statement Patient presents to clinic with excellent motivation to participate in therapy. Patient demonstrates persistent deficits in PFM coordination, PFM strength, and pain which are likely at least partially attributable to comorbid MS. Patient has responded positively to active and educational interventions throughout her course of care, and articulates readiness to discharge to self-management. Patient may benefit from future skilled therapeutic intervention to address remaining deficits in PFM coordination, bladder habits, posture, PFM strength, and pain to address returning/worsening PFM deficits in order to maintain function and improve overall QOL.    Personal Factors and Comorbidities Age;Behavior Pattern;Comorbidity 3+;Past/Current Experience;Time since onset of injury/illness/exacerbation    Comorbidities anxiety, GERD, MS, hypercholesteremia, abnormal LFTs, Hepatitis B core antibody positive    Examination-Activity Limitations Sleep;Continence;Toileting    Examination-Participation Restrictions Interpersonal Relationship;Community Activity;Occupation    Stability/Clinical Decision Making Evolving/Moderate complexity    Rehab Potential Fair    PT Frequency 1x / week    PT Duration 12 weeks    PT Treatment/Interventions ADLs/Self Care Home Management;Biofeedback;Cryotherapy;Electrical Stimulation;Moist Heat;Therapeutic activities;Therapeutic exercise;Neuromuscular re-education;Patient/family education;Orthotic Fit/Training;Manual  techniques;Taping;Joint Manipulations;Dry needling    PT Next Visit Plan    PT Home Exercise Plan [id number]   Consulted and Agree with Plan of Care Patient              Myles Gip PT, DPT [phone]  06/26/2021, 9:56 AM

## 2021-10-03 NOTE — Progress Notes (Signed)
Name: Allison Moon   MRN: [id number]    DOB: 17-Sep-1990   Date:10/04/2021       Progress Note  Subjective  Chief Complaint  Chief Complaint  Patient presents with   Annual Exam    HPI  Patient presents for annual CPE.  Diet: avoids gluten and dairy and grain.  Otherwise eats well balanced Exercise: walk 15 minutes about , not very often, discussed increasing physical activity to 150 min a week  Sleep: 6-7 hours Last dental exam:within 6 months Last eye exam: within the year  [place] Visit from 10/04/2021 in [facility]  AUDIT-C Score 0      Depression: Phq 9 is  negative    10/04/2021    8:32 AM 02/06/2021   11:46 AM 01/21/2021    9:58 AM  Depression screen PHQ 2/9  Decreased Interest 0 0 1  Down, Depressed, Hopeless 0 0 1  PHQ - 2 Score 0 0 2  Altered sleeping   1  Tired, decreased energy   1  Change in appetite   1  Feeling bad or failure about yourself    1  Trouble concentrating   1  Moving slowly or fidgety/restless   1  Suicidal thoughts   0  PHQ-9 Score   8  Difficult doing work/chores   Somewhat difficult   Hypertension: BP Readings from Last 3 Encounters:  10/04/21 118/72  03/11/21 108/64  01/21/21 110/72   Obesity: Wt Readings from Last 3 Encounters:  10/04/21 122 lb (55.3 kg)  03/11/21 121 lb (54.9 kg)  01/21/21 125 lb 6.4 oz (56.9 kg)   BMI Readings from Last 3 Encounters:  10/04/21 20.94 kg/m  03/11/21 20.77 kg/m  01/21/21 21.52 kg/m     Vaccines:  HPV: up to at age 1 , ask insurance if age between 90-45  Shingrix: 55-64 yo and ask insurance if covered when patient above 29 yo Pneumonia:  educated and discussed with patient. Flu:  educated and discussed with patient.  Hep C Screening: 05/31/2021 STD testing and prevention (HIV/chl/gon/syphilis): 11/26/2020 Intimate partner violence:none Sexual History : yes Menstrual History/LMP/Abnormal Bleeding: [facility]: 09/26/2021, regular Incontinence Symptoms: yes,  she does have MS  Breast cancer:  - Last Mammogram: no concerns does not qualify - BRCA gene screening: none  Osteoporosis: Discussed high calcium and vitamin D supplementation, weight bearing exercises  Cervical cancer screening: 04/30/2020  Skin cancer: Discussed monitoring for atypical lesions  Colorectal cancer: does not qualify   Lung cancer:  Low Dose CT Chest recommended if Age 64-80 years, 20 pack-year currently smoking OR have quit w/in 15years. Patient does not qualify.   ECG: 01/15/2021  Advanced Care Planning: A voluntary discussion about advance care planning including the explanation and discussion of advance directives.  Discussed health care proxy and Living will, and the patient was able to identify a health care proxy as husband.  Patient does not have a living will at present time. If patient does have living will, I have requested they bring this to the clinic to be scanned in to their chart.  Lipids: No results found for: "CHOL" No results found for: "HDL" No results found for: "LDLCALC" No results found for: "TRIG" No results found for: "CHOLHDL" No results found for: "LDLDIRECT"  Glucose: Glucose, Bld  Date Value Ref Range Status  01/12/2021 97 70 - 99 mg/dL Final    Comment:    Glucose reference range applies only to samples taken after fasting for at  least 8 hours.    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  Family History  Problem Relation Age of Onset   Diabetes Mother    Hyperlipidemia Mother    Hypertension Mother    Hypertension Father    Hyperlipidemia Father    Diabetes Father    Depression Sister    Anxiety disorder Sister    Hypertension Brother     Social History   Socioeconomic History   Marital status: Married    Spouse name: george   Number of children: 0   Years of education: Not on file   Highest education level: Not on file  Occupational History   Not on file  Tobacco Use   Smoking status: Never   Smokeless tobacco: Never  Vaping Use   Vaping Use: Never used  Substance and Sexual Activity   Alcohol use: Yes    Comment: occ   Drug use: Never   Sexual activity: Yes    Partners: Male  Other Topics Concern   Not on file  Social History Narrative   Originally from [place] and moved to [place] in 2022. Have 5 siblings.    Social Determinants of Health   Financial Resource Strain: Medium Risk (10/04/2021)   Overall Financial Resource Strain (CARDIA)    Difficulty of Paying Living Expenses: Somewhat hard  Food Insecurity: No Food Insecurity (10/04/2021)   Hunger Vital Sign    Worried About Running Out of Food in the Last Year: Never true    Ran Out of Food in the Last Year: Never true  Transportation Needs: No Transportation Needs (10/04/2021)   PRAPARE - Hydrologist (Medical): No    Lack of Transportation (Non-Medical): No  Physical Activity: Inactive (10/04/2021)   Exercise Vital Sign    Days of Exercise per Week: 0 days    Minutes of Exercise per Session: 0 min  Stress: No Stress Concern Present (10/04/2021)   [place]    Feeling of Stress : Not at all  Social Connections: [place] (10/04/2021)   Social Connection and Isolation Panel [NHANES]    Frequency of Communication with  Friends and Family: More than three times a week    Frequency of Social Gatherings with Friends and Family: More than three times a week    Attends Religious Services: 1 to 4 times per year    Active Member of [employer] or Organizations: Yes    Attends Archivist Meetings: 1 to 4 times per year    Marital Status: Married  Human resources officer Violence: Not At Risk (10/04/2021)   Humiliation, Afraid, Rape, and Kick questionnaire    Fear of Current or Ex-Partner: No    Emotionally Abused: No    Physically Abused: No    Sexually Abused: No     Current Outpatient Medications:  albuterol (VENTOLIN HFA) 108 (90 Base) MCG/ACT inhaler, , Disp: , Rfl:    azelastine (OPTIVAR) 0.05 % ophthalmic solution, 1 drop into affected eye, Disp: , Rfl:    baclofen (LIORESAL) 10 MG tablet, 1 tablet (10 mg total), Disp: , Rfl:    EPINEPHrine 0.3 mg/0.3 mL IJ SOAJ injection, Inject 0.3 mg into the muscle as needed., Disp: , Rfl:    ergocalciferol (VITAMIN D2) 1.25 MG (50000 UT) capsule, Take 1 capsule by mouth daily., Disp: , Rfl:    famotidine (PEPCID) 40 MG tablet, Take 1 tablet by mouth at bedtime., Disp: , Rfl:    pantoprazole (PROTONIX) 40 MG tablet, Please take one tablet 3 times a week., Disp: , Rfl:    triamcinolone (NASACORT) 55 MCG/ACT AERO nasal inhaler, Place 2 sprays into the nose daily., Disp: , Rfl:    betamethasone dipropionate 0.05 % lotion, SMARTSIG:2 Topical Twice Daily, Disp: , Rfl:    clobetasol (TEMOVATE) 0.05 % external solution, Apply topically 2 (two) times daily., Disp: , Rfl:    Glatiramer Acetate 40 MG/ML SOSY, 3 (three) times a week, Disp: , Rfl:    levocetirizine (XYZAL) 5 MG tablet, 1 tablet, Disp: , Rfl:    MYRBETRIQ 25 MG TB24 tablet, Take 25 mg by mouth daily., Disp: , Rfl:    VEMLIDY 25 MG TABS, Take 1 tablet by mouth daily., Disp: , Rfl:   Allergies  Allergen Reactions   Avocado Other (See Comments) and Nausea And Vomiting   Citrullus Vulgaris Nausea Only   Nsaids  Other (See Comments)     ROS  Constitutional: Negative for fever or weight change.  Respiratory: Negative for cough and shortness of breath.   Cardiovascular: Negative for chest pain or palpitations.  Gastrointestinal: Negative for abdominal pain, no bowel changes.  Musculoskeletal: Negative for gait problem or joint swelling.  Skin: Negative for rash.  Neurological: Negative for dizziness or headache.  No other specific complaints in a complete review of systems (except as listed in HPI above).   Objective  Vitals:   10/04/21 0828  BP: 118/72  Pulse: 70  Resp: 18  Temp: 97.9 F (36.6 C)  TempSrc: Oral  SpO2: 98%  Weight: 122 lb (55.3 kg)  Height: 5' 4"  (1.626 m)    Body mass index is 20.94 kg/m.  Physical Exam   Constitutional: Patient appears well-developed and well-nourished. No distress.  HENT: Head: Normocephalic and atraumatic. Ears: B TMs ok, no erythema or effusion; Nose: Nose normal. Mouth/Throat: Oropharynx is clear and moist. No oropharyngeal exudate.  Eyes: Conjunctivae and EOM are normal. Pupils are equal, round, and reactive to light. No scleral icterus.  Neck: Normal range of motion. Neck supple. No JVD present. No thyromegaly present.  Cardiovascular: Normal rate, regular rhythm and normal heart sounds.  No murmur heard. No BLE edema. Pulmonary/Chest: Effort normal and breath sounds normal. No respiratory distress. Abdominal: Soft. Bowel sounds are normal, no distension. There is no tenderness. no masses Breast: no lumps or masses, no nipple discharge or rashes Musculoskeletal: Normal range of motion, no joint effusions. No gross deformities Neurological: he is alert and oriented to person, place, and time. No cranial nerve deficit. Coordination, balance, strength, speech and gait are normal.  Skin: Skin is warm and dry. No rash noted. No erythema.  Psychiatric: Patient has a normal mood and affect. behavior is normal. Judgment and thought content normal.    No results found for this or any previous visit (from the past 2160 hour(s)).  Fall Risk:    10/04/2021    8:31 AM 02/06/2021   11:46 AM 01/21/2021    9:57 AM  Fall Risk   Falls in the past year? 1 0 1  Number falls in past yr: 1 0 1  Comment 6    Injury with Fall? 0 0 0  Risk for fall due to : History of fall(s)  History of fall(s)  Follow up Falls evaluation completed Falls evaluation completed Falls evaluation completed     Functional Status Survey: Is the patient deaf or have difficulty hearing?: No Does the patient have difficulty seeing, even when wearing glasses/contacts?: No Does the patient have difficulty concentrating, remembering, or making decisions?: Yes Does the patient have difficulty walking or climbing stairs?: Yes Does the patient have difficulty dressing or bathing?: Yes Does the patient have difficulty doing errands alone such as visiting a doctor's office or shopping?: No   Assessment & Plan  1. Annual physical exam Patient up-to-date on labs and pap Increase physical activity to 150 min a week -discussed doing weight bearing exercises  -USPSTF grade A and B recommendations reviewed with patient; age-appropriate recommendations, preventive care, screening tests, etc discussed and encouraged; healthy living encouraged; see AVS for patient education given to patient -Discussed importance of 150 minutes of physical activity weekly, eat two servings of fish weekly, eat one serving of tree nuts ( cashews, pistachios, pecans, almonds.Marland Kitchen) every other day, eat 6 servings of fruit/vegetables daily and drink plenty of water and avoid sweet beverages.

## 2021-10-04 ENCOUNTER — Ambulatory Visit (INDEPENDENT_AMBULATORY_CARE_PROVIDER_SITE_OTHER): Payer: BC Managed Care – PPO | Admitting: Nurse Practitioner

## 2021-10-04 ENCOUNTER — Encounter: Payer: Self-pay | Admitting: Nurse Practitioner

## 2021-10-04 ENCOUNTER — Other Ambulatory Visit: Payer: Self-pay

## 2021-10-04 VITALS — BP 118/72 | HR 70 | Temp 97.9°F | Resp 18 | Ht 64.0 in | Wt 122.0 lb

## 2021-10-04 DIAGNOSIS — H1045 Other chronic allergic conjunctivitis: Secondary | ICD-10-CM | POA: Insufficient documentation

## 2021-10-04 DIAGNOSIS — J301 Allergic rhinitis due to pollen: Secondary | ICD-10-CM | POA: Insufficient documentation

## 2021-10-04 DIAGNOSIS — J4520 Mild intermittent asthma, uncomplicated: Secondary | ICD-10-CM | POA: Insufficient documentation

## 2021-10-04 DIAGNOSIS — Z0000 Encounter for general adult medical examination without abnormal findings: Secondary | ICD-10-CM | POA: Diagnosis not present

## 2021-10-04 DIAGNOSIS — J3081 Allergic rhinitis due to animal (cat) (dog) hair and dander: Secondary | ICD-10-CM | POA: Insufficient documentation

## 2021-10-04 DIAGNOSIS — J3089 Other allergic rhinitis: Secondary | ICD-10-CM | POA: Insufficient documentation

## 2021-12-30 ENCOUNTER — Encounter: Payer: Self-pay | Admitting: Nurse Practitioner

## 2022-03-18 ENCOUNTER — Encounter: Payer: Self-pay | Admitting: Nurse Practitioner

## 2022-04-29 ENCOUNTER — Ambulatory Visit: Payer: Self-pay

## 2022-04-29 NOTE — Telephone Encounter (Signed)
Alexa with [facility] reports they will be working with pt. Her contact number is [phone]. Reports pt. Has a dairy allergy. Also PHQ9 score is 17, has passive suicidal thoughts, no plan. Answer Assessment - Initial Assessment Questions 1. REASON FOR CALL: "What is your main concern right now?"     Letting PCP know they will be working with pt. 2. ONSET: "When did the  start?"     N/a 3. SEVERITY: "How bad is the ?"     N/a 4. FEVER: "Do you have a fever?"     N/a 5. OTHER SYMPTOMS: "Do you have any other new symptoms?"     N/a 6. TREATMENTS AND RESPONSE: "What have you done so far to try to make this better? What medicines have you used?"     N/a 7. PREGNANCY: "Is there any chance you are pregnant?" "When was your last menstrual period?"     N/a  Protocols used: No Guideline Available-A-AH

## 2022-08-08 ENCOUNTER — Ambulatory Visit (LOCAL_COMMUNITY_HEALTH_CENTER): Payer: Self-pay

## 2022-08-08 ENCOUNTER — Telehealth: Payer: Self-pay

## 2022-08-08 VITALS — Ht 64.0 in | Wt 127.0 lb

## 2022-08-08 DIAGNOSIS — R7612 Nonspecific reaction to cell mediated immunity measurement of gamma interferon antigen response without active tuberculosis: Secondary | ICD-10-CM

## 2022-08-08 NOTE — Telephone Encounter (Signed)
Call pt: Complete Epi -EPI- has she had past TB testing that was negative? And contacts that she knows of?  -Need her meds reconciled, especially is she on any immunosuppressants or steroids?  -Did she ever go for that CXR? If not, she can go to [facility] and we'll cover the cost  -Could she come in for a follow up QFT, no charge? Just a quick lab visit for now.

## 2022-08-08 NOTE — Telephone Encounter (Signed)
Phone call to pt at [phone]. Pt confirmed identity.  Epi completed. Dr Abbie Sons questions addressed.  Lab only appt scheduled for 08/12/22.

## 2022-08-08 NOTE — Progress Notes (Signed)
Information documented was obtained and confirmed during phone interview with pt.  Ht and wt per pt.  Pt states she had BCG vaccine when she was an infant.  Previous TB tests: --In High school 2005, PPD, positive. 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 --Had not yet been diagnosed with MS, no medications that would affect immune system.  Does not know of anyone that has had TB, no contact that she knows of.  Has night sweats, most nights, believes it is related to MS. Has to change night clothes, or sometimes change the sheet covering her if not in night clothes.  Had a negative Hep B test, but Dr did additional testing and found she had had Hep B in the past. Dr told her she had Hep B at some point, virus still is in her body. She is taking medications to suppress Hep B while taking MS infusions. Vemlidy med for Hep B. Never got sick with it. Dr states most likely exposed in [place] as a child.  States her last infusion (for MS) and steroids was almost 6 months ago. Next infusion should be next week, actually scheduled for 08/14/22.  Had chest x-ray on 08/05/22, same day as repeat lab.  Discussed Latent vs Active TB. Discussed latent TB medication.  Pt is agreeable to come to [facility] for another QFT Gold test at no charge. Scheduled for 08/12/22, lab only.

## 2022-08-12 ENCOUNTER — Other Ambulatory Visit (LOCAL_COMMUNITY_HEALTH_CENTER): Payer: Self-pay

## 2022-08-12 DIAGNOSIS — Z111 Encounter for screening for respiratory tuberculosis: Secondary | ICD-10-CM

## 2022-08-12 NOTE — Progress Notes (Signed)
In nurse clinic for QFT (no charge). ROI signed. Explained to patient that [facility] will call her with results when they finalize. All questions answered and patient verbalizes understanding.  Abagail Kitchens, RN

## 2022-08-13 ENCOUNTER — Telehealth: Payer: Self-pay | Admitting: Surgery

## 2022-08-13 ENCOUNTER — Telehealth: Payer: Self-pay

## 2022-08-13 ENCOUNTER — Other Ambulatory Visit: Payer: BC Managed Care – PPO

## 2022-08-13 DIAGNOSIS — R7612 Nonspecific reaction to cell mediated immunity measurement of gamma interferon antigen response without active tuberculosis: Secondary | ICD-10-CM

## 2022-08-13 DIAGNOSIS — Z021 Encounter for pre-employment examination: Secondary | ICD-10-CM

## 2022-08-13 NOTE — Progress Notes (Signed)
Pre employment UDS completed and cleared for [employer].

## 2022-08-13 NOTE — Telephone Encounter (Signed)
Patient is a 32yo female [place] from [place] who emigrated to the [place] in 2005. 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 The patient also has MS and started IV biologic injections over the last two years, her last in 2023 was Ocrevus.   Her most recent CXR has new abnormalities, showing "likely calcified granulomas of both left and right lungs." There is no other concerning findings, no consolidation, no effusion, no noticeable lymphadenopathy or other signs of active disease. However, the patient does complain of regular night sweats that have been occurring for some time, on the order of years. Otherwise, she denies fevers, cough, overt sputum production, hemoptysis. She does think she can get up enough sputum for a specimen.  She has had one positive QFT and one indeterminate QFT, we are awaiting one more QFT result at the moment.    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 She was scheduled for IV MS treatment this week but the procedure was cancelled due to her abnormal CXR findings.   [name], MD

## 2022-08-13 NOTE — Telephone Encounter (Signed)
Client came to health department inquiring about test results for QFT that was drawn yesterday.   She was informed the result were not yet back.  Consulted with Dr. Wyvonnia Lora regarding if she would be  okay to work.  Client advise to not report to work until after she hears back from Dr. Wyvonnia Lora.   Client verbalized understanding.  Alejos Reinhardt Sherrilyn Rist, RN

## 2022-08-15 ENCOUNTER — Other Ambulatory Visit: Payer: Self-pay | Admitting: Surgery

## 2022-08-15 ENCOUNTER — Telehealth: Payer: Self-pay | Admitting: Surgery

## 2022-08-15 DIAGNOSIS — R7612 Nonspecific reaction to cell mediated immunity measurement of gamma interferon antigen response without active tuberculosis: Secondary | ICD-10-CM

## 2022-08-15 NOTE — Telephone Encounter (Signed)
Pt has had one +QFT, one indeterminate QFT, and another pending. Patient had a recent CXR read at another facility calling questionable granulomas but no active disease.   Informed patient that we may need follow up CXR if we cannot get images to review of last CXR.    Patient reports she has no active cough, cannot produce sputum (she has tried to collect for [place]), no other concerning symptoms, no signs of active TB.   Patient is OK to go to work, no restrictions. We will follow up with her this weekend/Monday about whether we need to gt repeat CXR or not.  [name], MD

## 2022-08-19 ENCOUNTER — Ambulatory Visit
Admission: RE | Admit: 2022-08-19 | Discharge: 2022-08-19 | Disposition: A | Discharge status: Home / Self Care | Payer: BC Managed Care – PPO | Source: Ambulatory Visit | Attending: Surgery | Admitting: Surgery

## 2022-08-19 ENCOUNTER — Ambulatory Visit
Admission: RE | Admit: 2022-08-19 | Discharge: 2022-08-19 | Disposition: A | Discharge status: Home / Self Care | Payer: BC Managed Care – PPO | Attending: Surgery | Admitting: Surgery

## 2022-08-19 DIAGNOSIS — R7612 Nonspecific reaction to cell mediated immunity measurement of gamma interferon antigen response without active tuberculosis: Secondary | ICD-10-CM | POA: Diagnosis not present

## 2022-08-22 ENCOUNTER — Telehealth: Payer: Self-pay | Admitting: Surgery

## 2022-08-22 DIAGNOSIS — Z227 Latent tuberculosis: Secondary | ICD-10-CM

## 2022-08-22 NOTE — Telephone Encounter (Signed)
Explained active versus latent TB, and informed patient of having a negative CXR and no evidence of active TB disease. Her follow up CXR did not show any evidence of granulomas that her 08/05/2022 CXR at  [facility] referred to as possibly seeing.   Explained if she starts treatment, it would be a 4 month course taking two capsules of an antibiotic, Rifampin, every day.   Explained the medication, office visits and any necessary imaging or labs would be of no cost to the patient.   The patient is nervous about treatment and is not ready to decide whether or not to proceed with treatment for latent TB. Patient will take the weekend to think about it. Will contact her next week to answer any additional questions and get her decision.   Of note, informed her that if she wants to take biologic immunosuppressants for her MS, her providers will insist she take LTBI treatment ahead of time.   [name], MD

## 2022-08-27 ENCOUNTER — Telehealth: Payer: Self-pay | Admitting: Surgery

## 2022-08-27 DIAGNOSIS — Z227 Latent tuberculosis: Secondary | ICD-10-CM

## 2022-08-27 NOTE — Telephone Encounter (Signed)
Calling to see if patient has decided whether or not to start treatment for LTBI.    Asked her to please call us back if she has any questions, and to let us know what she has decided as far as treatment.    Sent same information via text as well.   [name], MD

## 2022-09-17 ENCOUNTER — Other Ambulatory Visit: Payer: Self-pay | Admitting: Surgery

## 2022-09-17 ENCOUNTER — Telehealth: Payer: Self-pay | Admitting: Surgery

## 2022-09-17 DIAGNOSIS — Z227 Latent tuberculosis: Secondary | ICD-10-CM

## 2022-09-17 NOTE — Telephone Encounter (Signed)
Spoke with pt to confirm medications she is taking.   She is taking everything on the list except: Optivar, protonix and Glatiramer   She will finish Xifaxan and neomycin tabs on Friday. No major med interactions with Rifampin. Confirmed appt on Fri.   [name], MD

## 2022-09-17 NOTE — Progress Notes (Addendum)
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   Diagnosis of LTBI and pertinent labs/info: +QFT: 07/29/2022 CXR: no signs active TB 08/19/2022 EPI: 08/08/2022  Therapy: Rifampin 600mg  daily for 4 months Planned LTBI therapy start date: 09/19/2022  PCP: Debbra Riding PA-C  Tuberculosis treatment orders  All patients are to be monitored per [place] and county TB policies.   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 Treat for latent TB per the following:  Rifampin 600mg  daily by mouth x 4 months per standing orders ([place]) per Dr. Wyvonnia Lora.   Patient on multiple medications but none have concerning interactions with Rifampin.  [name] is an anti-viral for the patient's hepatitis history. Rifampin does decrease efficacy of Vemlidy, but not to the extent that it is considered even a minor interaction.   Last CMP: 02/17/2022 LFTs wnl Last CBC: 01/30/2022 No anemia, normal WBC  Pt has history of hepatitis B infection/core antibody positive, elevated LFTs in 2023.   Obtain baseline LFTs for patient, no CBC needed.  If patient's LFTs are normal at start visit, she does not need labs at subsequent visits unless symptoms arise such as persistent rash, itching, nosebleeds, easy bruising, nausea, vomiting, dark colored urine, the patient starts new potentially hepatotoxic medications, or significant alcohol consumption is reported that would require monitoring.  Offer HIV and syphilis at start visit, if declined HIV patient must sign waiver.    [name], MD

## 2022-09-19 ENCOUNTER — Ambulatory Visit (LOCAL_COMMUNITY_HEALTH_CENTER): Payer: Self-pay

## 2022-09-19 VITALS — Wt 131.0 lb

## 2022-09-19 DIAGNOSIS — Z227 Latent tuberculosis: Secondary | ICD-10-CM

## 2022-09-19 LAB — HM HIV SCREENING LAB: HM HIV Screening: NEGATIVE

## 2022-09-19 MED ORDER — RIFAMPIN 300 MG PO CAPS
600.0000 mg | ORAL_CAPSULE | Freq: Every day | ORAL | Status: AC
Start: 2022-09-19 — End: 2022-10-19

## 2022-09-19 NOTE — Progress Notes (Signed)
In nurse clinic for LTBI / TB Med Start / Rifampin # 1  / Labs: LFT's, HIV, RPR  Patient to start meds for MS soon. Medical hx reviewed. Meds reviewed. Takes multiple vitamins and supplements prn. Per patient, "when I remember".    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 Has F-u with [facility] Renee Pain)  Has regular f-u with providers at [facility], including:  Dr. Hillis Range - [facility] / Marin Olp PA [facility] Dr. Jenelle Mages- [facility] Debbra Riding, [place] - PCP at [facility]  LTBI vs Active TB reviewed and given Rifampin info sheet given and reviewed.  TB consent signed.  ROI signed.  HIV consent signed. Agrees to HIV and RPR testing.   Alcohol: denies. Advised to abstain from alcohol as Rifampin can damage the liver.   BCM: per pt, fertility awareness and condoms. Advised against pregnancy while on Rifampin  Patient counseled regarding decreased efficacy of hormone based birth control while taking Rifampin. Patient reminded of the importance using a back-up method of birth control while taking Rifampin. Patient verbalized understanding.  Condoms accepted   The patient was dispensed Rifampin 300 mg #60 (Bottle #1) today per Dr Levonne Hubert. Instructions reviewed at length.  I provided counseling today regarding the medication. We discussed the medication, the side effects and when to call clinic. Patient given the opportunity to ask questions. Questions answered.    TB contact card given and advised to contact with questions, concerns, side effects.   Next TB med appt scheduled for 10/14/2022 at 9:30 per pt preference. Appt card given.   RN walked patient to lab for HIV, RPR, LFT's. Jerel Shepherd, RN

## 2022-09-20 LAB — HEPATIC FUNCTION PANEL
ALT: 19 IU/L (ref 0–32)
AST: 20 IU/L (ref 0–40)
Albumin: 4.4 g/dL (ref 3.9–4.9)
Alkaline Phosphatase: 63 IU/L (ref 44–121)
Bilirubin Total: 0.2 mg/dL (ref 0.0–1.2)
Bilirubin, Direct: <0.1 mg/dL (ref 0.00–0.40)
Total Protein: 7.5 g/dL (ref 6.0–8.5)

## 2022-09-23 DIAGNOSIS — Z227 Latent tuberculosis: Secondary | ICD-10-CM | POA: Insufficient documentation

## 2022-10-03 ENCOUNTER — Telehealth: Payer: Self-pay | Admitting: Surgery

## 2022-10-03 DIAGNOSIS — Z227 Latent tuberculosis: Secondary | ICD-10-CM

## 2022-10-03 NOTE — Telephone Encounter (Signed)
Faxed LTBI start treatment forms to:  Jenelle Mages MD [facility] Phone: [phone]   Fax: [phone]    Debbra Riding PA-C PCP [facility] Phone:  Phone: [phone]  Fax: [phone]   [name], MD

## 2022-10-08 ENCOUNTER — Encounter: Payer: BC Managed Care – PPO | Admitting: Nurse Practitioner

## 2022-10-14 ENCOUNTER — Ambulatory Visit (LOCAL_COMMUNITY_HEALTH_CENTER): Payer: Self-pay

## 2022-10-14 VITALS — Wt 128.5 lb

## 2022-10-14 DIAGNOSIS — Z227 Latent tuberculosis: Secondary | ICD-10-CM

## 2022-10-14 MED ORDER — RIFAMPIN 300 MG PO CAPS
600.0000 mg | ORAL_CAPSULE | Freq: Every day | ORAL | Status: AC
Start: 2022-10-14 — End: 2022-11-13

## 2022-10-14 NOTE — Progress Notes (Addendum)
In nurse clinic for LTBI / TB Med Management / Rifampin # 2  / Labs: LFT's Interpreter none / speaks and understands English  Taking Rifampin as prescribed  Denies Missing any pills:  Per patient, has 12 pills (6 day supply) remaining in current bottle.  Advised to complete this bottle before starting bottle that will be dispensed today.   New meds: none / see med list  New Health problems:   *Reports epigastric pain after starting Rifampin with "feeling of fullness" and "reflux". States taking Rifampin with food made symptoms worse. Patient switched to taking Rifampin at 12 noon without food and states symptoms have improved but not gone completely.   *Headaches have increased to 3x/week since starting Rifampin, takes Tylenol with improvement *Complains of feeling "down" more often  Phone consult Dr Levonne Hubert who orders LFT's today. Offers patient option to continue Rifampin as prescribed or to take short one week break from Rifampin then restart med.  Asks RN to assess patient commitment level in taking Rifampin. Provider also relays that depression is not a symptom related to Rifampin.   RN discussed provider recommendations. Patient states she wants to continue Rifampin as prescribed and does not want to take short Rifampin break. Patient explains she is unable to take MS injections until Rifampin is completed and is committed to finishing Rifampin.   Alcohol: denies. Advised to abstain from alcohol as Rifampin can damage the liver.   BCM: Per pt, "fertility awareness and condoms" Advised against preg while on Rifampin Patient counseled regarding decreased efficacy of hormone based birth control while taking Rifampin. Patient reminded of the importance using a back-up method of birth control while taking Rifampin. Patient verbalized understanding.  Condoms  declined  The patient was dispensed Rifampin 300 mg #60 (Bottle #2) today per order by Dr Levonne Hubert. I provided counseling today  regarding the medication. We discussed the medication, the side effects and when to call clinic. Patient given the opportunity to ask questions. Questions answered.    TB contact card given and advised to contact with questions, concerns, side effects.   Next TB med appt scheduled for 11/10/2022 at 11 am (per pt preference). Appt card given.   RN walked patient to lab for LFT's.   Notified by lab  that LFT's not drawn today. Unable to obtain enough blood for test today. Lab staff counseled patient to hydrate before blood draw. Lab states patient may return on Friday for lab draw. RN notified Efrain Sella, RN and Dr Levonne Hubert (via Epic message) regarding situation.  Jerel Shepherd, RN

## 2022-10-16 ENCOUNTER — Other Ambulatory Visit (LOCAL_COMMUNITY_HEALTH_CENTER): Payer: BC Managed Care – PPO | Admitting: Surgery

## 2022-10-16 DIAGNOSIS — Z227 Latent tuberculosis: Secondary | ICD-10-CM

## 2022-10-16 NOTE — Progress Notes (Unsigned)
Patient here for LFTs, couldn't draw at previous visit.   Patient provided with lab paperwork and walked to the lab. Will follow up with results.   [name], MD

## 2022-10-16 NOTE — Progress Notes (Signed)
MD Attestation for TB RN: I agree with the care provided to this patient and the plan for follow up and treatment.  Avyan Livesay M. Tish Begin, MD  

## 2022-10-16 NOTE — Addendum Note (Signed)
Addended by: [name] on: 10/16/2022 11:48 AM   Modules accepted: Orders

## 2022-10-17 ENCOUNTER — Telehealth: Payer: Self-pay | Admitting: Surgery

## 2022-10-17 DIAGNOSIS — Z227 Latent tuberculosis: Secondary | ICD-10-CM

## 2022-10-17 LAB — HEPATIC FUNCTION PANEL
ALT: 13 [IU]/L (ref 0–32)
AST: 18 [IU]/L (ref 0–40)
Albumin: 4.5 g/dL (ref 3.9–4.9)
Alkaline Phosphatase: 68 [IU]/L (ref 44–121)
Bilirubin Total: 0.6 mg/dL (ref 0.0–1.2)
Bilirubin, Direct: 0.26 mg/dL (ref 0.00–0.40)
Total Protein: 7.1 g/dL (ref 6.0–8.5)

## 2022-10-17 NOTE — Telephone Encounter (Signed)
Patient is aware that LFTs are completely normal.  She will continue Rifampin as planned and let us know if any concerning side effects arise.   RIF #3 scheduled for 11/10/2022  [name], MD

## 2022-10-27 ENCOUNTER — Telehealth: Payer: Self-pay | Admitting: Surgery

## 2022-10-27 DIAGNOSIS — Z227 Latent tuberculosis: Secondary | ICD-10-CM

## 2022-10-27 NOTE — Telephone Encounter (Signed)
Pt called asking if RIF can interfere with menstrual cycle, she is 5 days late and having some nausea.   Informed pt I have had some patients in the past w cycle changes but nothing dangerous.   Advised patient to take pregnancy test and continue meds if SE are mild.   Patient called back and reported that pregnancy test was negative, and she will continue with her TB meds for now.  [name], MD

## 2022-11-08 ENCOUNTER — Emergency Department
Admission: EM | Admit: 2022-11-08 | Discharge: 2022-11-08 | Disposition: A | Discharge status: Home / Self Care | Payer: BC Managed Care – PPO | Attending: Emergency Medicine | Admitting: Emergency Medicine

## 2022-11-08 ENCOUNTER — Other Ambulatory Visit: Payer: Self-pay

## 2022-11-08 DIAGNOSIS — R110 Nausea: Secondary | ICD-10-CM | POA: Insufficient documentation

## 2022-11-08 LAB — COMPREHENSIVE METABOLIC PANEL
ALT: 18 U/L (ref 0–44)
AST: 18 U/L (ref 15–41)
Albumin: 3.8 g/dL (ref 3.5–5.0)
Alkaline Phosphatase: 44 U/L (ref 38–126)
Anion gap: 5 (ref 5–15)
BUN: 13 mg/dL (ref 6–20)
CO2: 26 mmol/L (ref 22–32)
Calcium: 8.4 mg/dL — ABNORMAL LOW (ref 8.9–10.3)
Chloride: 104 mmol/L (ref 98–111)
Creatinine, Ser: 0.97 mg/dL (ref 0.44–1.00)
GFR, Estimated: >60 mL/min (ref >60)
Glucose, Bld: 99 mg/dL (ref 70–99)
Potassium: 4.2 mmol/L (ref 3.5–5.1)
Sodium: 135 mmol/L (ref 135–145)
Total Bilirubin: 0.8 mg/dL (ref 0.3–1.2)
Total Protein: 6.7 g/dL (ref 6.5–8.1)

## 2022-11-08 LAB — CBC
HCT: 37.8 % (ref 36.0–46.0)
Hemoglobin: 13.4 g/dL (ref 12.0–15.0)
MCH: 28.1 pg (ref 26.0–34.0)
MCHC: 35.4 g/dL (ref 30.0–36.0)
MCV: 79.2 fL — ABNORMAL LOW (ref 80.0–100.0)
Platelets: 250 10*3/uL (ref 150–400)
RBC: 4.77 MIL/uL (ref 3.87–5.11)
RDW: 14.1 % (ref 11.5–15.5)
WBC: 5.3 10*3/uL (ref 4.0–10.5)
nRBC: 0 % (ref 0.0–0.2)

## 2022-11-08 LAB — HCG, QUANTITATIVE, PREGNANCY: hCG, Beta Chain, Quant, S: <1 m[IU]/mL (ref <5)

## 2022-11-08 LAB — LIPASE, BLOOD: Lipase: 28 U/L (ref 11–51)

## 2022-11-08 MED ORDER — PROCHLORPERAZINE MALEATE 10 MG PO TABS: 10.0000 mg | ORAL_TABLET | Freq: Three times a day (TID) | ORAL | 1 refills | Status: AC | PRN

## 2022-11-08 MED ORDER — PROCHLORPERAZINE MALEATE 10 MG PO TABS
10.0000 mg | ORAL_TABLET | Freq: Once | ORAL | Status: AC
  Administered 2022-11-08: 10 mg via ORAL
  Filled 2022-11-08: qty 1

## 2022-11-08 NOTE — ED Provider Notes (Signed)
[facility] Emergency Department Provider Note     Event Date/Time   First MD Initiated Contact with Patient 11/08/22 1618     (approximate)   History   Nausea   HPI  Allison Moon is a 32 y.o. female with a history of HBc antibody, MS, and current treatment with rifampin for TB, presents to the ED with a 1 week complaint of ongoing nausea.  Patient has been taking Zofran as prescribed.  She denies any associate abdominal pain, vomiting, or diarrhea.  She reports her LMP was 9/17, and has had 2 negative pregnancy test at home.   Physical Exam   Triage Vital Signs: ED Triage Vitals  Encounter Vitals Group     BP 11/08/22 1420 105/70     Systolic BP Percentile --      Diastolic BP Percentile --      Pulse Rate 11/08/22 1420 73     Resp 11/08/22 1420 17     Temp 11/08/22 1420 98.2 F (36.8 C)     Temp Source 11/08/22 1420 Oral     SpO2 11/08/22 1420 100 %     Weight 11/08/22 1417 130 lb (59 kg)     Height 11/08/22 1417 5\' 4"  (1.626 m)     Head Circumference --      Peak Flow --      Pain Score 11/08/22 1417 9     Pain Loc --      Pain Education --      Exclude from Growth Chart --     Most recent vital signs: Vitals:   11/08/22 1420 11/08/22 1723  BP: 105/70 102/67  Pulse: 73 75  Resp: 17 15  Temp: 98.2 F (36.8 C) 97.8 F (36.6 C)  SpO2: 100% 99%    General Awake, no distress. NAD HEENT NCAT. PERRL. EOMI. No rhinorrhea. Mucous membranes are moist.  CV:  Good peripheral perfusion. RRR RESP:  Normal effort. CTA ABD:  No distention. Soft, nontender   ED Results / Procedures / Treatments   Labs (all labs ordered are listed, but only abnormal results are displayed) Labs Reviewed  COMPREHENSIVE METABOLIC PANEL - Abnormal; Notable for the following components:      Result Value   Calcium 8.4 (*)    All other components within normal limits  CBC - Abnormal; Notable for the following components:   MCV 79.2 (*)    All other  components within normal limits  LIPASE, BLOOD  HCG, QUANTITATIVE, PREGNANCY   EKG   RADIOLOGY  No results found.   PROCEDURES:  Critical Care performed: No  Procedures   MEDICATIONS ORDERED IN ED: Medications  prochlorperazine (COMPAZINE) tablet 10 mg (10 mg Oral Given 11/08/22 1722)     IMPRESSION / MDM / ASSESSMENT AND PLAN / ED COURSE  I reviewed the triage vital signs and the nursing notes.                              Differential diagnosis includes, but is not limited to, drug side effect, viral gastroenteritis, anovulatory state, pregnancy  Patient's presentation is most consistent with acute complicated illness / injury requiring diagnostic workup.  Patient's diagnosis is consistent with nausea and vomiting, likely due to to her current treatment with rifampin.  Patient with reassuring exam and workup at this time.  No evidence of acute lab abnormalities including leukocytosis or critical anemia.  Patient will be  discharged home with prescriptions for Compazine. Patient is to follow up with primary provider as discussed, as needed or otherwise directed. Patient is given ED precautions to return to the ED for any worsening or new symptoms.     FINAL CLINICAL IMPRESSION(S) / ED DIAGNOSES   Final diagnoses:  Nausea without vomiting     Rx / DC Orders   ED Discharge Orders          Ordered    prochlorperazine (COMPAZINE) 10 MG tablet  Every 8 hours PRN        11/08/22 1646             Note:  This document was prepared using Dragon voice recognition software and may include unintentional dictation errors.    Lissa Hoard, PA-C 11/10/22 2328    Minna Antis, MD 11/10/22 [phone]

## 2022-11-08 NOTE — ED Notes (Signed)
Waiting on main pharmacy to send up prochlorperazine.

## 2022-11-08 NOTE — ED Triage Notes (Addendum)
Pt sts that she has been having nausea for the last week and been taking zofran at home. Pt sts that she has not had any diarrhea or vomiting. Pt sts that she has taken two pregnancy test and they both have come back negative. Pt sts that her LMP was on 9/17.

## 2022-11-08 NOTE — ED Notes (Addendum)
Pharmacy tubed medication. Waiting for med to arrive.

## 2022-11-08 NOTE — Discharge Instructions (Signed)
Take the prescription med as directed. Follow-up with your provider as scheduled.

## 2022-11-10 ENCOUNTER — Ambulatory Visit (LOCAL_COMMUNITY_HEALTH_CENTER): Payer: Self-pay

## 2022-11-10 VITALS — Wt 126.5 lb

## 2022-11-10 DIAGNOSIS — Z227 Latent tuberculosis: Secondary | ICD-10-CM

## 2022-11-10 NOTE — Progress Notes (Signed)
In nurse clinic for LTBI / TB Med Management / Rifampin Bottle #3 NOT Dispensed today / Labs: None   Taking Rifampin as prescribed then stopped on 11/06/22 d/t overwhelming nausea / No vomiting.  Per pt, #18 pills (9 day supply)  remaining in current bottle.   Changes in  Meds:  stopped her supplements d/t nausea.  Per pt, taking Allergy med, pepcid, Baclofen prn, topicals See med list.   New meds: Compazine (from ER 11/08/22)  New Health problems:  Patient reports increased nausea / no vomiting since starting Rifampin. Worsening nausea approx 10 days ago and prescribed Zofran which helped for a few days then "stopped working" and nausea worsened.  Epigastric pain and "burning" Constipation - last BM 10/26 Rash /itching both ears that has improved since stopping Rifampin  Stopped Rifampin on 10/24 Eating once daily d/t nausea . Ate bread, peanuts and soda yesterday.  Lost 2 lbs since visit on 10/14/22  Seen in ER on 10/26 for nausea, labs ok, preg test negative. Per pt, last sex 10/10/22.  Ordered Compazine and continues to take  Patient explains she is "scared" about finishing Rifampin and unsure how she will finish 2 more bottles of Rifampin with her current symptoms.   Alcohol: denies. Advised to abstain from alcohol as Rifampin can damage the liver.   BCM:  Patient counseled regarding decreased efficacy of hormone based birth control while taking Rifampin.Use "Fertility Awareness" and condoms for bcm.  Patient reminded of the importance using a back-up method of birth control while taking Rifampin. Patient verbalized understanding.  Condoms  declined  Phone consult Dr Levonne Hubert and informed of patient status.  Provider advises:  Stop Rifampin for next 2 weeks, Dr Wyvonnia Lora or Efrain Sella, RN will contact patient at that time to evaluate patient status and treatment regimen.  Encourage patient to stay hydrated and gradually  re introduce foods as tolerated. Hoping symptoms improve over  next 2 weeks.  Unsure whether Rifampin is responsible for her GI symptoms, but reassuring that recent labs Do Not indicate damage r/t liver, pancreas, gall bladder.  Continue to f-u with providers as needed  RN explained provider order/recommendations to patient. Patient is in agreement and plans to not take Rifampin for next 2 weeks.  RN discussed ways to improve nutrition and ways to introduce foods to diet, such as soups, broths, bland foods. Avoid spicy foods.  Advised to f-u with GI provider.    Advised to contact [facility] with questions, concerns. Jerel Shepherd, RN

## 2022-11-11 NOTE — Progress Notes (Signed)
MD Attestation for TB RN: I agree with the care provided to this patient and the plan for follow up and treatment.  Avyan Livesay M. Tish Begin, MD  

## 2022-11-25 ENCOUNTER — Telehealth: Payer: Self-pay | Admitting: Surgery

## 2022-11-25 DIAGNOSIS — Z227 Latent tuberculosis: Secondary | ICD-10-CM

## 2022-11-25 NOTE — Telephone Encounter (Signed)
Left message, will try again tomorrow. Calling to discuss if her nausea, other side effects have improved or resolved. Need to discuss whether or not to restart tx for LTBI with Rifampin or other medication regimen, it is her choice. Unfortunately, see provider note that she called PCP about persistent nausea, she has been off RIF for almost 3 weeks. Will discuss further with patient when can talk on phone.   [name], MD

## 2022-11-27 NOTE — Telephone Encounter (Signed)
Patient called back to report nausea has persisted for weeks and stopped just yesterday, PCP put her on new PPI, sx have improved.   Will wait another week to see if she is consistently feeling better and without nausea before restarting Rifampin or considering a different LTBI regimen. Regimens discussed included: INH x 6 months; INH/Rifapentine for 12 weeks, 1 dose weekly; or continuing with Rifampin.   Patient has completed 1 bottle of Rifampin (30 days) and about 21 days of a second bottle. She has about 69 more doses to finish treatment.  The patient started RIF on 09/19/2022 and has 6 months to complete the 4 month treatment plan. If she continues Rifampin, she will need to complete all of her doses by roughly, 03/14/2022.   The patient had many follow up questions about testing and outcomes. The patient asked if there was a test to show that the treatment had worked. Advised the patient that unfortunately no, we had no test to determine that all the TB bacteria had been killed. We have studies that show that about 85% of patients treated with LTBI never develop active TB, and that those who do develop active TB are usually immunocompromised for example with HIV, cancer or are on immunosuppressive medication. She is young and healthy and her chanced of developing active TB are probably much lower than 15% if she completes treatment.   However, she has MS and may need to go on immunosuppressive therapy, thus interviewer strongly encouraged patient to complete LTBI treatment, and in fact, she probably will not be able to be put on certain MS medications without it.   Also reminded patient that since our TB tests are based on the body's "memory of seeing the TB bacteria," they will always be positive on both skin tests and blood tests.   Encouraged patient to think about all we discussed but to not make any decisions right now, just focus on getting better.   Will reach out to patient in 1 week or so  to see about restarting rifampin treatment. Pt also has option of starting Rifampin again on her own if she is feeling well, and she will let me know if she restarts by phone/text.  [name], MD

## 2022-12-05 ENCOUNTER — Telehealth: Payer: Self-pay | Admitting: Surgery

## 2022-12-05 DIAGNOSIS — Z227 Latent tuberculosis: Secondary | ICD-10-CM

## 2022-12-05 NOTE — Telephone Encounter (Signed)
Pt has been feeling better since off RIF but still not at a tolerable baseline, still with occasional nausea and adbominal pain.   Patient feels "traumatized" by the side effects she experienced while on Rifampin, she attempted multiple anti-nausea medications and GERD meds such as zofran, pepcid, protonix and compazine. She does not want to restart LTBI tx w/rifampin at this time.   The patient will see a neurologist in a few weeks regarding her MS tx, and they will discuss the possible need to complete LTBI treatment which would allow her to go on MS medication that are immunosuppressants. She will contact us at the [facility] if/when she wants to try LTBI tx again, possibly with a different agent (INH/INH rifapentine).   Patient is closed to further TB follow up.   [name], MD

## 2023-05-27 IMAGING — CR DG CHEST 2V
1 series · 2 of 2 positions shown · non-contrast
Comparison: None.

CLINICAL DATA: chest pain

EXAM:
CHEST - 2 VIEW

[Series 1: dg chest 2 view · 0.14mm/px · 2 of 2 slices shown]
[im 1/2]
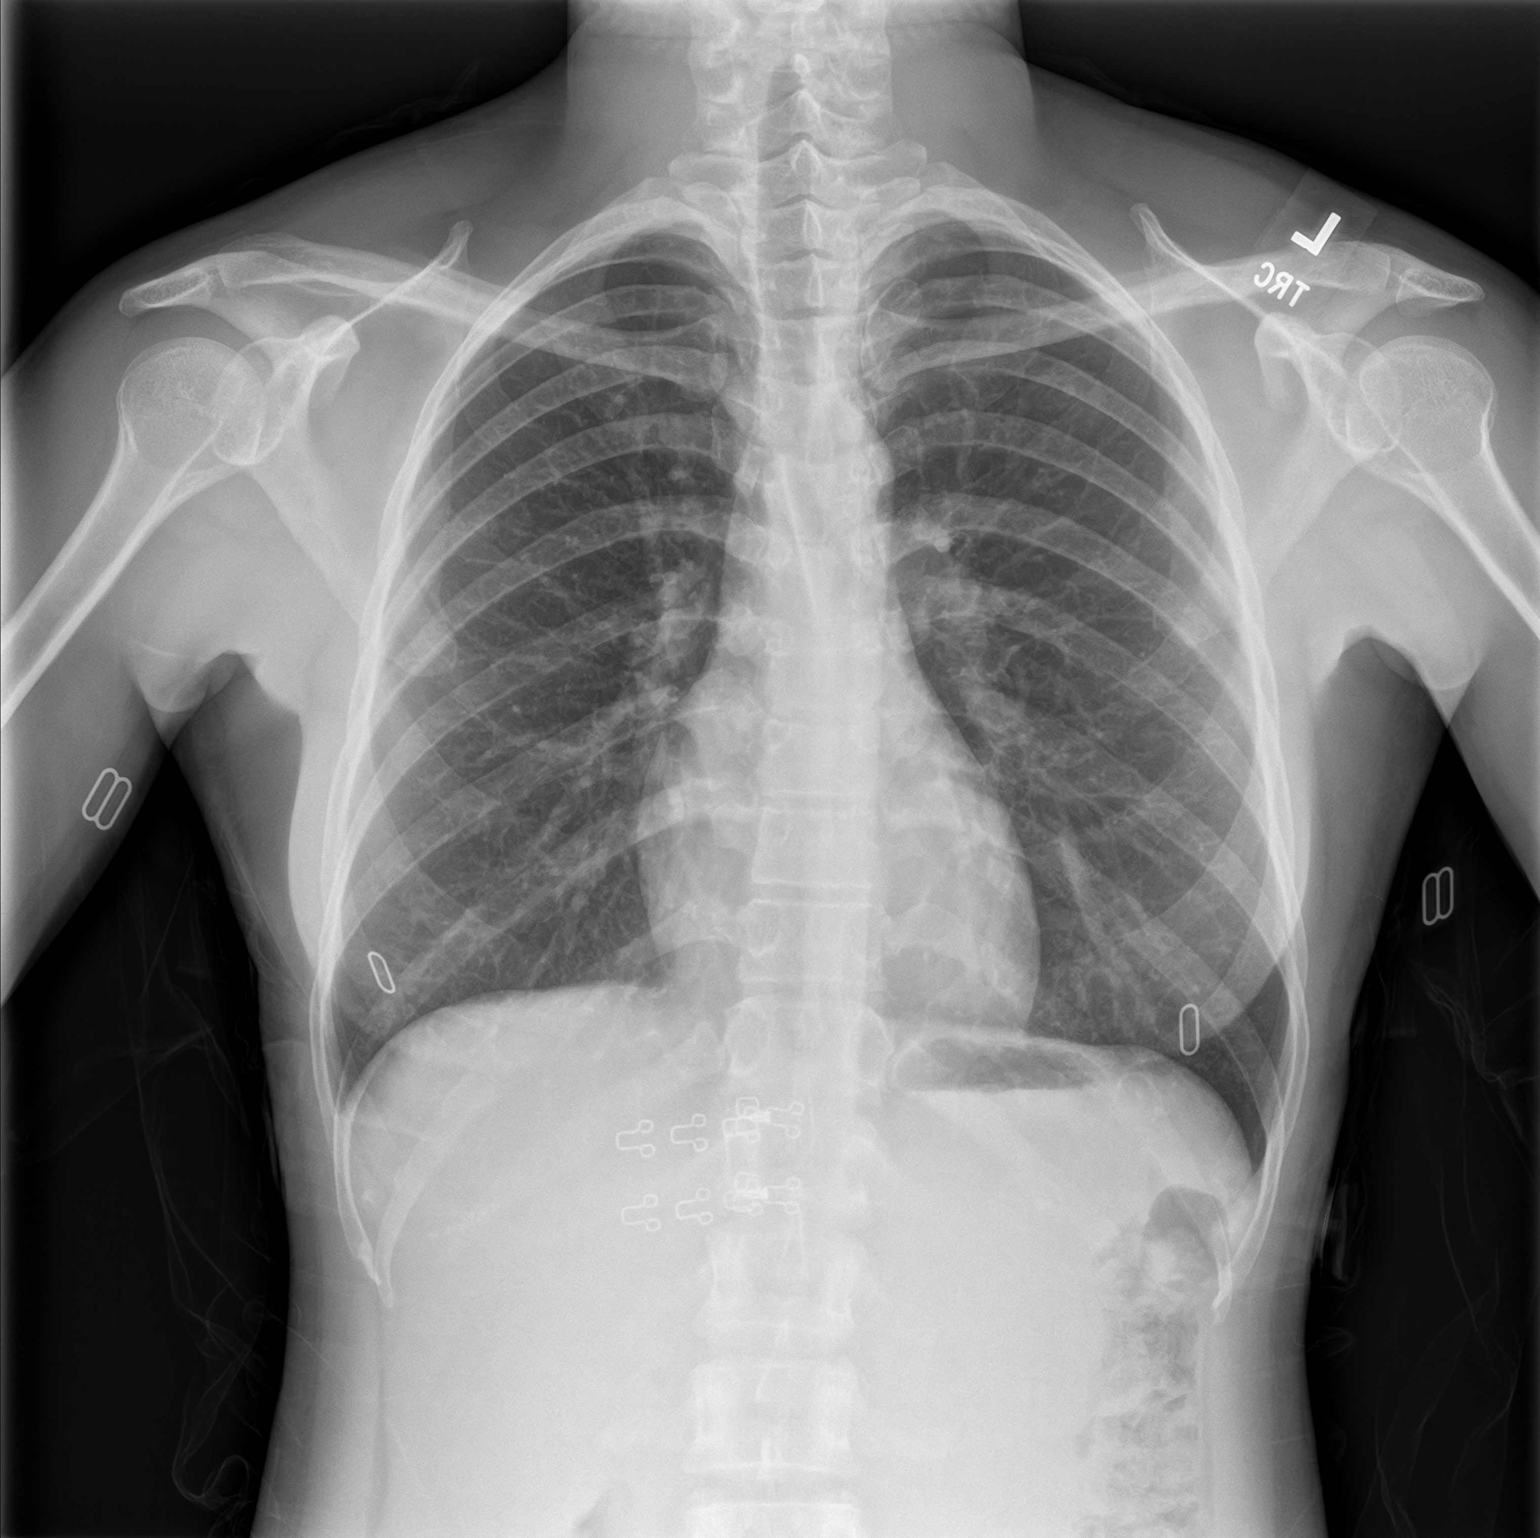
[im 2/2]
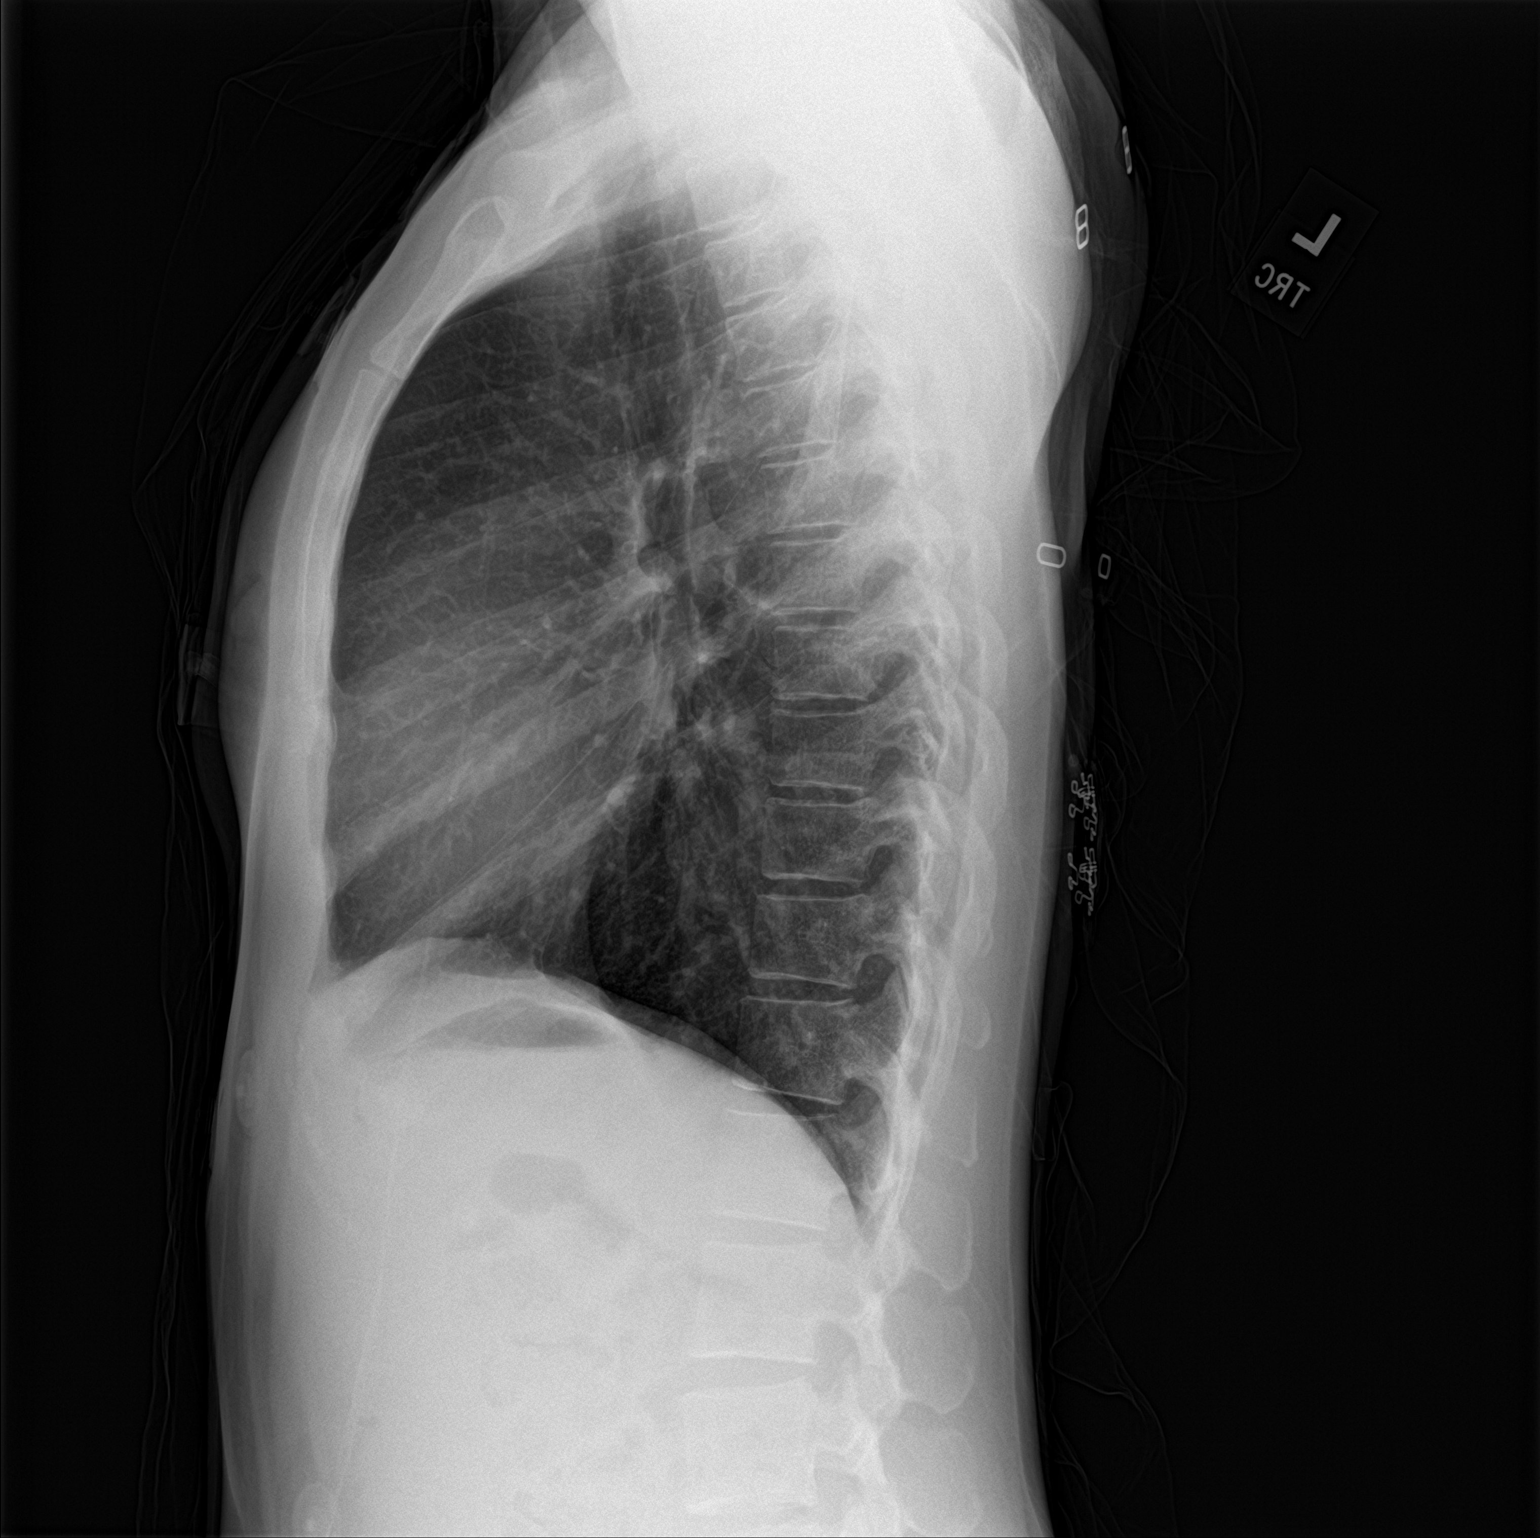

[2 of 2 positions shown; findings below may reference images not displayed]

FINDINGS: The heart and mediastinal contours are within normal limits.

No focal consolidation. No pulmonary edema. No pleural effusion. No
pneumothorax.

No acute osseous abnormality.
IMPRESSION: No active cardiopulmonary disease.

## 2023-09-10 NOTE — Patient Instructions (Addendum)
 VISIT SUMMARY: Today, we discussed your nighttime urinary urgency and frequency, as well as your daytime urinary symptoms and pelvic floor dysfunction. We reviewed your current symptoms, previous treatments, and made adjustments to your treatment plan to help manage your condition more effectively.  YOUR PLAN: -NOCTURNAL POLYURIA: Nocturnal polyuria means producing a large amount of urine at night. We will start you on DDAVP nasal spray, which you will use once nightly, alternating nostrils. This medication helps reduce nighttime urine production. We will check your blood sodium levels one week after starting DDAVP. Please avoid using DDAVP on days when you take prednisone and drink water with electrolytes instead of plain water. Let us  know through MyChart when you have obtained the DDAVP.  -URINARY FREQUENCY AND URGENCY: Your daytime urinary frequency and urgency have been managed with scheduled voiding every four hours. Since you experienced side effects with trospium, we have removed it from your medication list.  -HIGH-TONE PELVIC FLOOR DYSFUNCTION: High-tone pelvic floor dysfunction means your pelvic floor muscles are too tight, which can cause urinary symptoms. Your physical therapy has helped improve your daytime symptoms. Please continue with the pelvic floor exercises and stretches as instructed by your physical therapist.  INSTRUCTIONS: Please follow up with a metabolic panel one week after starting DDAVP to check your sodium levels. Avoid using DDAVP on days when you take prednisone. Notify us  through MyChart when you have obtained the DDAVP.  AI was used to generate this content from the visit notes.
